# Patient Record
Sex: Male | Born: 1965 | Race: White | Hispanic: No | Marital: Single | Smoking: Never smoker
Health system: Southern US, Community
[De-identification: ages and names within clinical notes are randomized; demographics above are authoritative.]

## PROBLEM LIST (undated history)

## (undated) DIAGNOSIS — M4802 Spinal stenosis, cervical region: Secondary | ICD-10-CM

## (undated) DIAGNOSIS — D649 Anemia, unspecified: Secondary | ICD-10-CM

## (undated) DIAGNOSIS — Z5189 Encounter for other specified aftercare: Secondary | ICD-10-CM

## (undated) DIAGNOSIS — Z86010 Personal history of colonic polyps: Secondary | ICD-10-CM

## (undated) DIAGNOSIS — R945 Abnormal results of liver function studies: Secondary | ICD-10-CM

## (undated) DIAGNOSIS — R7989 Other specified abnormal findings of blood chemistry: Secondary | ICD-10-CM

## (undated) DIAGNOSIS — L400 Psoriasis vulgaris: Secondary | ICD-10-CM

## (undated) DIAGNOSIS — M1990 Unspecified osteoarthritis, unspecified site: Secondary | ICD-10-CM

## (undated) DIAGNOSIS — E785 Hyperlipidemia, unspecified: Secondary | ICD-10-CM

## (undated) HISTORY — DX: Psoriasis vulgaris: L40.0

## (undated) HISTORY — DX: Hyperlipidemia, unspecified: E78.5

## (undated) HISTORY — DX: Anemia, unspecified: D64.9

## (undated) HISTORY — DX: Personal history of colonic polyps: Z86.010

## (undated) HISTORY — DX: Encounter for other specified aftercare: Z51.89

## (undated) HISTORY — DX: Other specified abnormal findings of blood chemistry: R79.89

## (undated) HISTORY — PX: TONSILLECTOMY: SUR1361

## (undated) HISTORY — PX: MANDIBLE SURGERY: SHX707

## (undated) HISTORY — DX: Spinal stenosis, cervical region: M48.02

## (undated) HISTORY — DX: Abnormal results of liver function studies: R94.5

---

## 2004-09-11 ENCOUNTER — Ambulatory Visit: Payer: Self-pay | Admitting: Internal Medicine

## 2004-09-18 ENCOUNTER — Ambulatory Visit: Payer: Self-pay | Admitting: Internal Medicine

## 2004-09-26 ENCOUNTER — Ambulatory Visit: Payer: Self-pay | Admitting: Internal Medicine

## 2005-12-28 ENCOUNTER — Ambulatory Visit: Payer: Self-pay | Admitting: Internal Medicine

## 2005-12-28 LAB — CONVERTED CEMR LAB
ALT: 32 units/L (ref 0–40)
AST: 33 units/L (ref 0–37)
Albumin: 4.2 g/dL (ref 3.5–5.2)
Alkaline Phosphatase: 40 units/L (ref 39–117)
BUN: 13 mg/dL (ref 6–23)
Basophils Absolute: 0 10*3/uL (ref 0.0–0.1)
Basophils Relative: 0.1 % (ref 0.0–1.0)
CO2: 28 meq/L (ref 19–32)
Calcium: 9.3 mg/dL (ref 8.4–10.5)
Chloride: 104 meq/L (ref 96–112)
Chol/HDL Ratio, serum: 4
Cholesterol: 261 mg/dL (ref 0–200)
Creatinine, Ser: 0.9 mg/dL (ref 0.4–1.5)
Eosinophil percent: 1.4 % (ref 0.0–5.0)
GFR calc non Af Amer: 99 mL/min
Glomerular Filtration Rate, Af Am: 120 mL/min/{1.73_m2}
Glucose, Bld: 91 mg/dL (ref 70–99)
HCT: 45.2 % (ref 39.0–52.0)
HDL: 65.4 mg/dL (ref >39.0)
Hemoglobin: 15.2 g/dL (ref 13.0–17.0)
LDL DIRECT: 153.4 mg/dL
Lymphocytes Relative: 31.9 % (ref 12.0–46.0)
MCHC: 33.7 g/dL (ref 30.0–36.0)
MCV: 93.1 fL (ref 78.0–100.0)
Monocytes Absolute: 0.7 10*3/uL (ref 0.2–0.7)
Monocytes Relative: 12.3 % — ABNORMAL HIGH (ref 3.0–11.0)
Neutro Abs: 3.3 10*3/uL (ref 1.4–7.7)
Neutrophils Relative %: 54.3 % (ref 43.0–77.0)
Platelets: 293 10*3/uL (ref 150–400)
Potassium: 4.1 meq/L (ref 3.5–5.1)
RBC: 4.86 M/uL (ref 4.22–5.81)
RDW: 11.4 % — ABNORMAL LOW (ref 11.5–14.6)
Sodium: 139 meq/L (ref 135–145)
TSH: 2.96 microintl units/mL (ref 0.35–5.50)
Total Bilirubin: 1 mg/dL (ref 0.3–1.2)
Total Protein: 7.6 g/dL (ref 6.0–8.3)
Triglyceride fasting, serum: 318 mg/dL (ref 0–149)
VLDL: 64 mg/dL — ABNORMAL HIGH (ref 0–40)
WBC: 6 10*3/uL (ref 4.5–10.5)

## 2006-01-12 ENCOUNTER — Ambulatory Visit: Payer: Self-pay | Admitting: Internal Medicine

## 2006-05-12 ENCOUNTER — Ambulatory Visit: Payer: Self-pay | Admitting: Internal Medicine

## 2006-05-12 LAB — CONVERTED CEMR LAB
ALT: 55 units/L — ABNORMAL HIGH (ref 0–40)
AST: 42 units/L — ABNORMAL HIGH (ref 0–37)
Albumin: 3.9 g/dL (ref 3.5–5.2)
Alkaline Phosphatase: 42 units/L (ref 39–117)
Bilirubin, Direct: 0.2 mg/dL (ref 0.0–0.3)
Cholesterol: 183 mg/dL (ref 0–200)
Direct LDL: 84.2 mg/dL
HDL: 64.6 mg/dL (ref >39.0)
Total Bilirubin: 1 mg/dL (ref 0.3–1.2)
Total CHOL/HDL Ratio: 2.8
Total Protein: 7 g/dL (ref 6.0–8.3)
Triglycerides: 216 mg/dL (ref 0–149)
VLDL: 43 mg/dL — ABNORMAL HIGH (ref 0–40)

## 2006-05-18 ENCOUNTER — Ambulatory Visit: Payer: Self-pay | Admitting: Internal Medicine

## 2006-08-31 DIAGNOSIS — E785 Hyperlipidemia, unspecified: Secondary | ICD-10-CM | POA: Insufficient documentation

## 2006-09-14 ENCOUNTER — Telehealth: Payer: Self-pay | Admitting: Internal Medicine

## 2006-09-30 ENCOUNTER — Telehealth (INDEPENDENT_AMBULATORY_CARE_PROVIDER_SITE_OTHER): Payer: Self-pay | Admitting: *Deleted

## 2006-10-01 ENCOUNTER — Telehealth: Payer: Self-pay | Admitting: Internal Medicine

## 2006-10-12 ENCOUNTER — Telehealth: Payer: Self-pay | Admitting: Internal Medicine

## 2006-12-28 ENCOUNTER — Telehealth: Payer: Self-pay | Admitting: Internal Medicine

## 2007-05-02 ENCOUNTER — Ambulatory Visit: Payer: Self-pay | Admitting: Internal Medicine

## 2007-05-04 ENCOUNTER — Encounter: Payer: Self-pay | Admitting: Internal Medicine

## 2007-05-04 LAB — CONVERTED CEMR LAB
Sex Hormone Binding: 26 nmol/L (ref 13–71)
Testosterone Free: 91.9 pg/mL (ref 47.0–244.0)
Testosterone-% Free: 2.3 % (ref 1.6–2.9)
Testosterone: 396.67 ng/dL (ref 350–890)

## 2007-05-10 LAB — CONVERTED CEMR LAB
ALT: 38 units/L (ref 0–53)
AST: 35 units/L (ref 0–37)
Albumin: 4 g/dL (ref 3.5–5.2)
Alkaline Phosphatase: 39 units/L (ref 39–117)
Bilirubin, Direct: 0.1 mg/dL (ref 0.0–0.3)
Cholesterol: 213 mg/dL (ref 0–200)
Direct LDL: 110.9 mg/dL
HDL: 71.9 mg/dL (ref >39.0)
Testosterone: 300.32 ng/dL — ABNORMAL LOW (ref 350.00–890)
Total Bilirubin: 0.8 mg/dL (ref 0.3–1.2)
Total CHOL/HDL Ratio: 3
Total Protein: 7 g/dL (ref 6.0–8.3)
Triglycerides: 225 mg/dL (ref 0–149)
VLDL: 45 mg/dL — ABNORMAL HIGH (ref 0–40)

## 2007-06-28 ENCOUNTER — Ambulatory Visit: Payer: Self-pay | Admitting: Internal Medicine

## 2008-04-06 ENCOUNTER — Ambulatory Visit: Payer: Self-pay | Admitting: Internal Medicine

## 2008-04-06 LAB — CONVERTED CEMR LAB
ALT: 50 units/L (ref 0–53)
AST: 38 units/L — ABNORMAL HIGH (ref 0–37)
Albumin: 4 g/dL (ref 3.5–5.2)
Alkaline Phosphatase: 59 units/L (ref 39–117)
Basophils Absolute: 0 10*3/uL (ref 0.0–0.1)
Basophils Relative: 0.3 % (ref 0.0–3.0)
Bilirubin, Direct: 0.2 mg/dL (ref 0.0–0.3)
Cholesterol: 186 mg/dL (ref 0–200)
Eosinophils Absolute: 0.1 10*3/uL (ref 0.0–0.7)
Eosinophils Relative: 1.4 % (ref 0.0–5.0)
HCT: 44.2 % (ref 39.0–52.0)
HDL: 66 mg/dL (ref >39.0)
Hemoglobin: 15.2 g/dL (ref 13.0–17.0)
LDL Cholesterol: 104 mg/dL — ABNORMAL HIGH (ref 0–99)
Lymphocytes Relative: 24.7 % (ref 12.0–46.0)
MCHC: 34.3 g/dL (ref 30.0–36.0)
MCV: 92.3 fL (ref 78.0–100.0)
Monocytes Absolute: 1 10*3/uL (ref 0.1–1.0)
Monocytes Relative: 11.2 % (ref 3.0–12.0)
Neutro Abs: 5.3 10*3/uL (ref 1.4–7.7)
Neutrophils Relative %: 62.4 % (ref 43.0–77.0)
Platelets: 224 10*3/uL (ref 150–400)
RBC: 4.79 M/uL (ref 4.22–5.81)
RDW: 11.9 % (ref 11.5–14.6)
Total Bilirubin: 1.1 mg/dL (ref 0.3–1.2)
Total CHOL/HDL Ratio: 2.8
Total Protein: 7.1 g/dL (ref 6.0–8.3)
Triglycerides: 80 mg/dL (ref 0–149)
VLDL: 16 mg/dL (ref 0–40)
WBC: 8.5 10*3/uL (ref 4.5–10.5)

## 2008-05-07 ENCOUNTER — Ambulatory Visit: Payer: Self-pay | Admitting: Family Medicine

## 2009-02-14 ENCOUNTER — Ambulatory Visit: Payer: Self-pay | Admitting: Internal Medicine

## 2009-02-14 DIAGNOSIS — L408 Other psoriasis: Secondary | ICD-10-CM | POA: Insufficient documentation

## 2009-04-01 ENCOUNTER — Ambulatory Visit: Payer: Self-pay | Admitting: Internal Medicine

## 2009-06-26 ENCOUNTER — Ambulatory Visit: Payer: Self-pay | Admitting: Family Medicine

## 2009-06-26 LAB — CONVERTED CEMR LAB
Bilirubin Urine: NEGATIVE
Glucose, Urine, Semiquant: NEGATIVE
Ketones, urine, test strip: NEGATIVE
Nitrite: NEGATIVE
Specific Gravity, Urine: >=1.03
Urobilinogen, UA: 0.2
WBC Urine, dipstick: NEGATIVE
pH: 5

## 2009-06-27 LAB — CONVERTED CEMR LAB
Basophils Absolute: 0 10*3/uL (ref 0.0–0.1)
Basophils Relative: 0.3 % (ref 0.0–3.0)
Eosinophils Absolute: 0.1 10*3/uL (ref 0.0–0.7)
Eosinophils Relative: 0.9 % (ref 0.0–5.0)
HCT: 42.7 % (ref 39.0–52.0)
Hemoglobin: 14.7 g/dL (ref 13.0–17.0)
Lymphocytes Relative: 23.6 % (ref 12.0–46.0)
Lymphs Abs: 2.5 10*3/uL (ref 0.7–4.0)
MCHC: 34.4 g/dL (ref 30.0–36.0)
MCV: 91.1 fL (ref 78.0–100.0)
Monocytes Absolute: 0.9 10*3/uL (ref 0.1–1.0)
Monocytes Relative: 8.4 % (ref 3.0–12.0)
Neutro Abs: 7 10*3/uL (ref 1.4–7.7)
Neutrophils Relative %: 66.8 % (ref 43.0–77.0)
Platelets: 199 10*3/uL (ref 150.0–400.0)
RBC: 4.69 M/uL (ref 4.22–5.81)
RDW: 13.1 % (ref 11.5–14.6)
WBC: 10.5 10*3/uL (ref 4.5–10.5)

## 2009-12-10 ENCOUNTER — Ambulatory Visit: Payer: Self-pay | Admitting: Family Medicine

## 2009-12-10 ENCOUNTER — Encounter: Payer: Self-pay | Admitting: Family Medicine

## 2009-12-10 LAB — CONVERTED CEMR LAB
ALT: 93 units/L — ABNORMAL HIGH (ref 0–53)
AST: 74 units/L — ABNORMAL HIGH (ref 0–37)
Albumin: 4.4 g/dL (ref 3.5–5.2)
Alkaline Phosphatase: 46 units/L (ref 39–117)
BUN: 18 mg/dL (ref 6–23)
Basophils Absolute: 0 10*3/uL (ref 0.0–0.1)
Basophils Relative: 0.3 % (ref 0.0–3.0)
Bilirubin Urine: NEGATIVE
Bilirubin, Direct: 0.2 mg/dL (ref 0.0–0.3)
Blood in Urine, dipstick: NEGATIVE
CO2: 27 meq/L (ref 19–32)
Calcium: 9.4 mg/dL (ref 8.4–10.5)
Chloride: 104 meq/L (ref 96–112)
Cholesterol: 224 mg/dL — ABNORMAL HIGH (ref 0–200)
Creatinine, Ser: 0.9 mg/dL (ref 0.4–1.5)
Direct LDL: 118.1 mg/dL
Eosinophils Absolute: 0.1 10*3/uL (ref 0.0–0.7)
Eosinophils Relative: 1.3 % (ref 0.0–5.0)
GFR calc non Af Amer: 96.82 mL/min (ref >60)
Glucose, Bld: 83 mg/dL (ref 70–99)
Glucose, Urine, Semiquant: NEGATIVE
HCT: 42.8 % (ref 39.0–52.0)
HDL: 74.3 mg/dL (ref >39.00)
Hemoglobin: 15.2 g/dL (ref 13.0–17.0)
Ketones, urine, test strip: NEGATIVE
Lymphocytes Relative: 29.8 % (ref 12.0–46.0)
Lymphs Abs: 2.3 10*3/uL (ref 0.7–4.0)
MCHC: 35.4 g/dL (ref 30.0–36.0)
MCV: 92.2 fL (ref 78.0–100.0)
Monocytes Absolute: 0.8 10*3/uL (ref 0.1–1.0)
Monocytes Relative: 9.6 % (ref 3.0–12.0)
Neutro Abs: 4.6 10*3/uL (ref 1.4–7.7)
Neutrophils Relative %: 59 % (ref 43.0–77.0)
Nitrite: NEGATIVE
Platelets: 217 10*3/uL (ref 150.0–400.0)
Potassium: 3.8 meq/L (ref 3.5–5.1)
Protein, U semiquant: NEGATIVE
RBC: 4.64 M/uL (ref 4.22–5.81)
RDW: 12.6 % (ref 11.5–14.6)
Sodium: 139 meq/L (ref 135–145)
Specific Gravity, Urine: 1.025
TSH: 2.71 microintl units/mL (ref 0.35–5.50)
Total Bilirubin: 1.3 mg/dL — ABNORMAL HIGH (ref 0.3–1.2)
Total CHOL/HDL Ratio: 3
Total Protein: 7.4 g/dL (ref 6.0–8.3)
Triglycerides: 166 mg/dL — ABNORMAL HIGH (ref 0.0–149.0)
Urobilinogen, UA: 0.2
VLDL: 33.2 mg/dL (ref 0.0–40.0)
WBC Urine, dipstick: NEGATIVE
WBC: 7.8 10*3/uL (ref 4.5–10.5)
pH: 5

## 2009-12-17 ENCOUNTER — Telehealth: Payer: Self-pay | Admitting: Family Medicine

## 2009-12-17 DIAGNOSIS — R945 Abnormal results of liver function studies: Secondary | ICD-10-CM | POA: Insufficient documentation

## 2009-12-24 ENCOUNTER — Encounter: Payer: Self-pay | Admitting: Family Medicine

## 2010-01-17 ENCOUNTER — Telehealth: Payer: Self-pay | Admitting: Family Medicine

## 2010-01-17 ENCOUNTER — Ambulatory Visit: Payer: Self-pay | Admitting: Family Medicine

## 2010-01-17 LAB — CONVERTED CEMR LAB
ALT: 53 units/L (ref 0–53)
AST: 39 units/L — ABNORMAL HIGH (ref 0–37)
Albumin: 4.2 g/dL (ref 3.5–5.2)
Alkaline Phosphatase: 44 units/L (ref 39–117)
Bilirubin, Direct: 0.1 mg/dL (ref 0.0–0.3)
Total Bilirubin: 0.9 mg/dL (ref 0.3–1.2)
Total Protein: 7.1 g/dL (ref 6.0–8.3)

## 2010-03-12 NOTE — Assessment & Plan Note (Signed)
Summary: dry skin problem/njr   Vital Signs:  Patient profile:   45 year old male Weight:      184 pounds Temp:     97.9 degrees F Pulse rate:   80 / minute Resp:     12 per minute BP sitting:   114 / 80  (left arm)  Vitals Entered By: Gladis Riffle, RN (February 14, 2009 7:50 AM)   History of Present Illness: has noted dry patches on knees and elbows for about 8 months.  pruritic has tried multiple emolients without success.   All other systems reviewed and were negative   Preventive Screening-Counseling & Management  Alcohol-Tobacco     Alcohol drinks/day: 3     Smoking Status: never  Allergies: 1)  Demerol (Meperidine Hcl)  Comments:  Nurse/Medical Assistant: c/o patches of dry skins on bilateral knees, right elbow, and now on forehead--Has tried several moisturizers and cortisone cream with only temporary relief  The patient's medications and allergies were reviewed with the patient and were updated in the Medication and Allergy Lists. Gladis Riffle, RN (February 14, 2009 7:52 AM)  Social History:  Never Smoked Alcohol use-yes Regular exercise-yes Married--divorced 2010  Physical Exam  General:  Well-developed,well-nourished,in no acute distress; alert,appropriate and cooperative throughout examination Head:  normocephalic and atraumatic.   Eyes:  pupils equal and pupils round.   Chest Wall:  No deformities, masses, tenderness or gynecomastia noted. Lungs:  normal respiratory effort and no intercostal retractions.   Heart:  normal rate and regular rhythm.   Skin:  large scaling plaques on knees and elbows bilaterally  2 cm area of scaling rash on forehead   Impression & Recommendations:  Problem # 1:  PSORIASIS (ICD-696.1) Assessment New discussed treatment plans and etiology trial topical steroids side effects discussed see me 4-6 weeks  Complete Medication List: 1)  Simvastatin 20 Mg Tabs (Simvastatin) .... Take 1 tablet by mouth at bedtime 2)   Cialis 20 Mg Tabs (Tadalafil) .Marland Kitchen.. 1 tablet every other day as needed 3)  Triamcinolone Acetonide 0.5 % Crea (Triamcinolone acetonide) .... Apply bid to affected area 4)  Desonide 0.05 % Crea (Desonide) .... Apply to affected area on face two times a day for 5 days and then at bedtime  Patient Instructions: 1)  see me 4 - 6 weeks  Prescriptions: DESONIDE 0.05 % CREA (DESONIDE) apply to affected area on face two times a day for 5 days and then at bedtime  #15 grams x 1   Entered and Authorized by:   Birdie Sons MD   Signed by:   Birdie Sons MD on 02/14/2009   Method used:   Electronically to        [facility]  [phone]* (retail)       [address] [place], [place]  [address]       Ph: [phone] or [phone]       Fax: [phone]   RxID:   [id number] TRIAMCINOLONE ACETONIDE 0.5 % CREA (TRIAMCINOLONE ACETONIDE) apply bid to affected area  #30 gram x 2   Entered and Authorized by:   Birdie Sons MD   Signed by:   Birdie Sons MD on 02/14/2009   Method used:   Electronically to        [facility]  [phone]* (retail)       [address] [place], [place]  [address]  Ph: [phone] or [phone]       Fax: [phone]   RxID:   [id number]

## 2010-03-12 NOTE — Progress Notes (Signed)
     New Problems: NONSPECIFIC ABNORMAL RESULTS LIVR FUNCTION STUDY (ICD-794.8)   New Problems: NONSPECIFIC ABNORMAL RESULTS LIVR FUNCTION STUDY (ICD-794.8)

## 2010-03-12 NOTE — Assessment & Plan Note (Signed)
Summary: abd pain/njr   Vital Signs:  Patient profile:   45 year old male Temp:     98.1 degrees F oral BP sitting:   130 / 84  (left arm) Cuff size:   regular  Vitals Entered By: Sid Falcon LPN (Jun 26, 2009 3:06 PM) CC: abd pain   History of Present Illness: Patient seen for a history left lower quadrant abdominal pain.  Onset 4 days ago.   Described as soreness moderate intensity and relatively continuous. No radiation. Denies any fever, chills, change in bowel habits, loss of appetite, or any urinary symptoms. He recalls somewhat similar episode at some point in the past. No known history of diverticular disease. No recent weight changes.  Patient does a lot of weight lifting but denies any recent injury. Pain worse with sitting up and somewhat with ambulation. No alleviating factors.  Tried advil without relief.  No back pain.  No hx kidney stones.  Allergies: 1)  Demerol (Meperidine Hcl)  Past History:  Past Medical History: Last updated: 04/01/2009 Hyperlipidemia Psoriasis (2011)  Past Surgical History: Last updated: 08/26/2006 Jaw sx Knee arthroscopy  Family History: Last updated: 08/26/2006 Pt adopted  Social History: Last updated: 02/14/2009  Never Smoked Alcohol use-yes Regular exercise-yes Married--divorced 2010  Risk Factors: Alcohol Use: 3 (04/01/2009) Exercise: yes (08/26/2006)  Risk Factors: Smoking Status: never (04/01/2009)  Review of Systems  The patient denies anorexia, fever, weight loss, chest pain, melena, hematochezia, severe indigestion/heartburn, hematuria, incontinence, suspicious skin lesions, enlarged lymph nodes, and testicular masses.    Physical Exam  General:  Well-developed,well-nourished,in no acute distress; alert,appropriate and cooperative throughout examination Mouth:  Oral mucosa and oropharynx without lesions or exudates.  Teeth in good repair. Neck:  No deformities, masses, or tenderness noted. Lungs:  Normal  respiratory effort, chest expands symmetrically. Lungs are clear to auscultation, no crackles or wheezes. Heart:  Normal rate and regular rhythm. S1 and S2 normal without gallop, murmur, click, rub or other extra sounds. Abdomen:  soft, non-tender, normal bowel sounds, no distention, no hepatomegaly, and no splenomegaly.cannot reproduce in the left lower quadrant tenderness to palpation the patient does have some pain with sitting forward   Genitalia:  Testes bilaterally descended without nodularity, tenderness or masses. No scrotal masses or lesions. No penis lesions or urethral discharge.   Impression & Recommendations:  Problem # 1:  ABDOMINAL PAIN, LEFT LOWER QUADRANT, HX OF (ICD-V15.89) ?diverticular vs abd muscular strain.  Check CBC ( UA unremarkable).   Orders: UA Dipstick w/o Micro (manual) (16109) TLB-CBC Platelet - w/Differential (85025-CBCD)  Complete Medication List: 1)  Simvastatin 20 Mg Tabs (Simvastatin) .... Take 1 tablet by mouth at bedtime 2)  Cialis 20 Mg Tabs (Tadalafil) .Marland Kitchen.. 1 tablet every other day as needed 3)  Triamcinolone Acetonide 0.5 % Crea (Triamcinolone acetonide) .... Apply bid to affected area 4)  Desonide 0.05 % Crea (Desonide) .... Apply to affected area on face two times a day for 5 days and then at bedtime 5)  Ciprofloxacin Hcl 500 Mg Tabs (Ciprofloxacin hcl) .... One by mouth two times a day for 10 days  Patient Instructions: 1)  follow up promptly if you have any fever or worsening abdominal pain Prescriptions: CIPROFLOXACIN HCL 500 MG TABS (CIPROFLOXACIN HCL) one by mouth two times a day for 10 days  #20 x 0   Entered and Authorized by:   Evelena Peat MD   Signed by:   Evelena Peat MD on 06/26/2009   Method used:  Electronically to        [facility]  [phone]* (retail)       [address] [place], [place]  [address]       Ph: [phone] or [phone]       Fax: [phone]   RxID:   [id number]   Laboratory  Results   Urine Tests    Routine Urinalysis   Color: yellow Appearance: Clear Glucose: negative   (Normal Range: Negative) Bilirubin: negative   (Normal Range: Negative) Ketone: negative   (Normal Range: Negative) Spec. Gravity: >=1.030   (Normal Range: 1.003-1.035) Blood: trace-intact   (Normal Range: Negative) pH: 5.0   (Normal Range: 5.0-8.0) Protein: trace   (Normal Range: Negative) Urobilinogen: 0.2   (Normal Range: 0-1) Nitrite: negative   (Normal Range: Negative) Leukocyte Esterace: negative   (Normal Range: Negative)    Comments: Sid Falcon LPN  Jun 26, 2009 4:25 PM

## 2010-03-12 NOTE — Assessment & Plan Note (Signed)
Summary: skin condition//lch   Vital Signs:  Patient profile:   45 year old male Temp:     98.3 degrees F oral BP sitting:   122 / 82  (left arm) Cuff size:   regular  Vitals Entered By: Kern Reap CMA Duncan Dull) (December 10, 2009 3:05 PM) CC: skin condition, labs   CC:  skin condition and labs.  History of Present Illness: and he is a 45 year old single male, nonsmoker, who comes in today for general physical examination  I have known him for many years.  He grew up in the neighborhood and then played football at [employer].  He currently works at [employer] and [employer] about 45 hours a week.  In his free time he goes to the gym.  He lives alone single.  He takes simvastatin 20 mg nightly for hyperlipidemia.  Will check cholesterol today.  He also uses Taclonex daily for 4 weeks, then off for the psoriasis.  Review of systems negative except for 6 beers a day  Allergies: 1)  Demerol (Meperidine Hcl)  Past History:  Past medical, surgical, family and social histories (including risk factors) reviewed, and no changes noted (except as noted below).  Past Medical History: Reviewed history from 04/01/2009 and no changes required. Hyperlipidemia Psoriasis (2011)  Past Surgical History: Reviewed history from 08/26/2006 and no changes required. Jaw sx Knee arthroscopy  Family History: Reviewed history from 08/26/2006 and no changes required. Pt adopted  Social History: Reviewed history from 02/14/2009 and no changes required.  Never Smoked Alcohol use-yes Regular exercise-yes Married--divorced 2010  Review of Systems      See HPI  Physical Exam  General:  Well-developed,well-nourished,in no acute distress; alert,appropriate and cooperative throughout examination Head:  Normocephalic and atraumatic without obvious abnormalities. No apparent alopecia or balding. Eyes:  No corneal or conjunctival inflammation noted. EOMI. Perrla. Funduscopic exam benign, without  hemorrhages, exudates or papilledema. Vision grossly normal. Ears:  External ear exam shows no significant lesions or deformities.  Otoscopic examination reveals clear canals, tympanic membranes are intact bilaterally without bulging, retraction, inflammation or discharge. Hearing is grossly normal bilaterally. Nose:  External nasal examination shows no deformity or inflammation. Nasal mucosa are pink and moist without lesions or exudates. Mouth:  Oral mucosa and oropharynx without lesions or exudates.  Teeth in good repair. Neck:  No deformities, masses, or tenderness noted. Chest Wall:  No deformities, masses, tenderness or gynecomastia noted. Breasts:  No masses or gynecomastia noted Lungs:  Normal respiratory effort, chest expands symmetrically. Lungs are clear to auscultation, no crackles or wheezes. Heart:  Normal rate and regular rhythm. S1 and S2 normal without gallop, murmur, click, rub or other extra sounds. Abdomen:  Bowel sounds positive,abdomen soft and non-tender without masses, organomegaly or hernias noted. Genitalia:  Testes bilaterally descended without nodularity, tenderness or masses. No scrotal masses or lesions. No penis lesions or urethral discharge. Msk:  No deformity or scoliosis noted of thoracic or lumbar spine.   Pulses:  R and L carotid,radial,femoral,dorsalis pedis and posterior tibial pulses are full and equal bilaterally Extremities:  No clubbing, cyanosis, edema, or deformity noted with normal full range of motion of all joints.   Neurologic:  No cranial nerve deficits noted. Station and gait are normal. Plantar reflexes are down-going bilaterally. DTRs are symmetrical throughout. Sensory, motor and coordinative functions appear intact. Skin:  Intact without suspicious lesions or rashes Cervical Nodes:  No lymphadenopathy noted Axillary Nodes:  No palpable lymphadenopathy Inguinal Nodes:  No significant adenopathy  Psych:  Cognition and judgment appear intact. Alert  and cooperative with normal attention span and concentration. No apparent delusions, illusions, hallucinations   Impression & Recommendations:  Problem # 1:  PSORIASIS (ICD-696.1) Assessment Unchanged  Orders: Venipuncture (16109) TLB-Lipid Panel (80061-LIPID) TLB-BMP (Basic Metabolic Panel-BMET) (80048-METABOL) TLB-CBC Platelet - w/Differential (85025-CBCD) TLB-Hepatic/Liver Function Pnl (80076-HEPATIC) TLB-TSH (Thyroid Stimulating Hormone) (60454-UJW) Prescription Created Electronically [phone]) UA Dipstick w/o Micro (automated)  (81003) Specimen Handling (78295)  Problem # 2:  HYPERLIPIDEMIA (ICD-272.4) Assessment: Improved  His updated medication list for this problem includes:    Simvastatin 20 Mg Tabs (Simvastatin) .Marland Kitchen... Take 1 tablet by mouth at bedtime  Orders: Venipuncture (62130) TLB-Lipid Panel (80061-LIPID) TLB-BMP (Basic Metabolic Panel-BMET) (80048-METABOL) TLB-CBC Platelet - w/Differential (85025-CBCD) TLB-Hepatic/Liver Function Pnl (80076-HEPATIC) TLB-TSH (Thyroid Stimulating Hormone) (86578-ION) Prescription Created Electronically [phone]) UA Dipstick w/o Micro (automated)  (81003) EKG w/ Interpretation (93000) Specimen Handling (84132)  Problem # 3:  Preventive Health Care (ICD-V70.0) Assessment: Unchanged  Complete Medication List: 1)  Simvastatin 20 Mg Tabs (Simvastatin) .... Take 1 tablet by mouth at bedtime 2)  Taclonex 0.005-0.064 % Oint (Calcipotriene-betameth diprop) .... Apply at bedtime  Other Orders: Tdap => 54yrs IM (44010) Admin 1st Vaccine (27253)  Patient Instructions: 1)  I will call you with your lab work. 2)  Continue to use thetaclonex small amounts daily for 4 weeks, then stop, then use again. 3)  Take an Aspirin every day. 4)  Continue the simvastatin, one nightly at bedtime 5)  Please schedule a follow-up appointment in 1 year. Prescriptions: SIMVASTATIN 20 MG TABS (SIMVASTATIN) Take 1 tablet by mouth at bedtime  #100 x 3    Entered and Authorized by:   Roderick Pee MD   Signed by:   Roderick Pee MD on 12/10/2009   Method used:   [facility] to        [facility]  [phone]* (retail)       [address] [place], [place]  [address]       Ph: [phone] or [phone]       Fax: [phone]   RxID:   [id number] TACLONEX 0.005-0.064 % OINT (CALCIPOTRIENE-BETAMETH DIPROP) apply at bedtime  #100 gr x 6   Entered and Authorized by:   Roderick Pee MD   Signed by:   Roderick Pee MD on 12/10/2009   Method used:   Electronically to        [facility]  [phone]* (retail)       [address] [place], [place]  [address]       Ph: [phone] or [phone]       Fax: [phone]   RxID:   [id number]    Orders Added: 1)  Est. Patient 40-64 years [99396] 2)  Venipuncture [89381] 3)  TLB-Lipid Panel [80061-LIPID] 4)  TLB-BMP (Basic Metabolic Panel-BMET) [80048-METABOL] 5)  TLB-CBC Platelet - w/Differential [85025-CBCD] 6)  TLB-Hepatic/Liver Function Pnl [80076-HEPATIC] 7)  TLB-TSH (Thyroid Stimulating Hormone) [84443-TSH] 8)  Prescription Created Electronically [G8553] 9)  UA Dipstick w/o Micro (automated)  [81003] 10)  EKG w/ Interpretation [93000] 11)  Tdap => 4yrs IM [90715] 12)  Admin 1st Vaccine [90471] 13)  Specimen Handling [99000]   Immunizations Administered:  Tetanus Vaccine:    Vaccine Type: Tdap    Site: right deltoid    Mfr: GlaxoSmithKline    Dose: 0.5 ml    Route: IM    Given  by: Kern Reap CMA (AAMA)    Exp. Date: 11/29/2011    Lot #: [id number]    Physician counseled: yes   Immunizations Administered:  Tetanus Vaccine:    Vaccine Type: Tdap    Site: right deltoid    Mfr: GlaxoSmithKline    Dose: 0.5 ml    Route: IM    Given by: Kern Reap CMA (AAMA)    Exp. Date: 11/29/2011    Lot #: [id number]    Physician counseled: yes   Laboratory Results   Urine Tests    Routine Urinalysis   Color: yellow Appearance:  Clear Glucose: negative   (Normal Range: Negative) Bilirubin: negative   (Normal Range: Negative) Ketone: negative   (Normal Range: Negative) Spec. Gravity: 1.025   (Normal Range: 1.003-1.035) Blood: negative   (Normal Range: Negative) pH: 5.0   (Normal Range: 5.0-8.0) Protein: negative   (Normal Range: Negative) Urobilinogen: 0.2   (Normal Range: 0-1) Nitrite: negative   (Normal Range: Negative) Leukocyte Esterace: negative   (Normal Range: Negative)    Comments: Rita Ohara  December 10, 2009 4:05 PM

## 2010-03-12 NOTE — Progress Notes (Signed)
  Phone Note Outgoing Call   Summary of Call: I call the M.D., and left a message that his liver functions have returned to normal Initial call taken by: Roderick Pee MD,  January 17, 2010 5:36 PM

## 2010-03-12 NOTE — Miscellaneous (Signed)
Summary: tdap  Clinical Lists Changes  Observations: Added new observation of TD BOOSTER: Historical (01/13/1999 16:54)      Immunization History:  Tetanus/Td Immunization History:    Tetanus/Td:  historical (01/13/1999)

## 2010-03-12 NOTE — Assessment & Plan Note (Signed)
Summary: 6 wk rov/njr   Vital Signs:  Patient profile:   45 year old male Weight:      184 pounds BMI:     28.08 Temp:     97.9 degrees F Pulse rate:   76 / minute Resp:     12 per minute BP sitting:   116 / 60  (left arm)  Vitals Entered By: Gladis Riffle, RN (April 01, 2009 7:52 AM) CC: 6 week rov, states not much change   Is Patient Diabetic? No   CC:  6 week rov and states not much change  .  History of Present Illness: psoriasis did well with cream but developed more patches duration--only 1 year--progressively worse minimal itch  Preventive Screening-Counseling & Management  Alcohol-Tobacco     Alcohol drinks/day: 3     Smoking Status: never  Allergies: 1)  Demerol (Meperidine Hcl)  Past History:  Past Surgical History: Last updated: 08/26/2006 Jaw sx Knee arthroscopy  Family History: Last updated: 08/26/2006 Pt adopted  Social History: Last updated: 02/14/2009  Never Smoked Alcohol use-yes Regular exercise-yes Married--divorced 2010  Risk Factors: Alcohol Use: 3 (04/01/2009) Exercise: yes (08/26/2006)  Risk Factors: Smoking Status: never (04/01/2009)  Past Medical History: Hyperlipidemia Psoriasis (2011)  Physical Exam  Skin:  turgor normal and color normal.    circumscibed plaques, erythematous and scaley.    Impression & Recommendations:  Problem # 1:  PSORIASIS (ICD-696.1)  progressive... refer dermatology had initial discussion about other therapies including immune modifyers.   Orders: Dermatology Referral (Derma)  Complete Medication List: 1)  Simvastatin 20 Mg Tabs (Simvastatin) .... Take 1 tablet by mouth at bedtime 2)  Cialis 20 Mg Tabs (Tadalafil) .Marland Kitchen.. 1 tablet every other day as needed 3)  Triamcinolone Acetonide 0.5 % Crea (Triamcinolone acetonide) .... Apply bid to affected area 4)  Desonide 0.05 % Crea (Desonide) .... Apply to affected area on face two times a day for 5 days and then at bedtime

## 2011-01-12 ENCOUNTER — Other Ambulatory Visit: Payer: Self-pay | Admitting: Family Medicine

## 2011-11-06 ENCOUNTER — Other Ambulatory Visit: Payer: Self-pay | Admitting: Neurosurgery

## 2011-11-13 ENCOUNTER — Encounter (HOSPITAL_COMMUNITY): Payer: Self-pay | Admitting: Pharmacy Technician

## 2011-11-17 ENCOUNTER — Encounter (HOSPITAL_COMMUNITY): Payer: Self-pay

## 2011-11-17 ENCOUNTER — Encounter (HOSPITAL_COMMUNITY)
Admission: RE | Admit: 2011-11-17 | Discharge: 2011-11-17 | Disposition: A | Discharge status: Home / Self Care | Payer: 59 | Source: Ambulatory Visit | Attending: Neurosurgery | Admitting: Neurosurgery

## 2011-11-17 HISTORY — DX: Unspecified osteoarthritis, unspecified site: M19.90

## 2011-11-17 LAB — CBC
HCT: 40.8 % (ref 39.0–52.0)
Hemoglobin: 14.1 g/dL (ref 13.0–17.0)
MCH: 31.5 pg (ref 26.0–34.0)
MCHC: 34.6 g/dL (ref 30.0–36.0)
MCV: 91.1 fL (ref 78.0–100.0)
Platelets: 218 10*3/uL (ref 150–400)
RBC: 4.48 MIL/uL (ref 4.22–5.81)
RDW: 12.3 % (ref 11.5–15.5)
WBC: 8.4 10*3/uL (ref 4.0–10.5)

## 2011-11-17 LAB — COMPREHENSIVE METABOLIC PANEL
ALT: 61 U/L — ABNORMAL HIGH (ref 0–53)
AST: 50 U/L — ABNORMAL HIGH (ref 0–37)
Albumin: 3.9 g/dL (ref 3.5–5.2)
Alkaline Phosphatase: 56 U/L (ref 39–117)
BUN: 17 mg/dL (ref 6–23)
CO2: 28 mEq/L (ref 19–32)
Calcium: 9.5 mg/dL (ref 8.4–10.5)
Chloride: 102 mEq/L (ref 96–112)
Creatinine, Ser: 0.91 mg/dL (ref 0.50–1.35)
GFR calc Af Amer: >90 mL/min (ref >90)
GFR calc non Af Amer: >90 mL/min (ref >90)
Glucose, Bld: 87 mg/dL (ref 70–99)
Potassium: 3.9 mEq/L (ref 3.5–5.1)
Sodium: 139 mEq/L (ref 135–145)
Total Bilirubin: 0.5 mg/dL (ref 0.3–1.2)
Total Protein: 7.1 g/dL (ref 6.0–8.3)

## 2011-11-17 LAB — SURGICAL PCR SCREEN
MRSA, PCR: NEGATIVE
Staphylococcus aureus: NEGATIVE

## 2011-11-17 NOTE — Pre-Procedure Instructions (Signed)
[address]  11/17/2011   Your procedure is scheduled on:  Monday November 23, 2011 0955 AM  Report to [facility] at [phone] AM.  Call this number if you have problems the morning of surgery: [phone]   Remember:   Do not eat food or drink:After Midnight.Sunday      Take these medicines the morning of surgery with A SIP OF WATER: None   Do not wear jewelry  Do not wear lotions, powders, or perfumes. You may wear deodorant.             Men may shave face and neck.  Do not bring valuables to the hospital.  Contacts, dentures or bridgework may not be worn into surgery.  Leave suitcase in the car. After surgery it may be brought to your room.  For patients admitted to the hospital, checkout time is 11:00 AM the day of discharge.   Patients discharged the day of surgery will not be allowed to drive home.    Special Instructions: Shower using CHG 2 nights before surgery and the night before surgery.  If you shower the day of surgery use CHG.  Use special wash - you have one bottle of CHG for all showers.  You should use approximately 1/3 of the bottle for each shower.   Please read over the following fact sheets that you were given: Pain Booklet, Coughing and Deep Breathing, MRSA Information and Surgical Site Infection Prevention

## 2011-11-17 NOTE — Progress Notes (Signed)
Primary Physician - Dr. Alonza Smoker at [facility] Does not have a cardiologist no prior cardiac work-up

## 2011-11-22 MED ORDER — CEFAZOLIN SODIUM-DEXTROSE 2-3 GM-% IV SOLR
2.0000 g | INTRAVENOUS | Status: AC
  Administered 2011-11-23: 2 g via INTRAVENOUS
  Filled 2011-11-22: qty 50

## 2011-11-23 ENCOUNTER — Ambulatory Visit (HOSPITAL_COMMUNITY): Payer: 59

## 2011-11-23 ENCOUNTER — Encounter (HOSPITAL_COMMUNITY)
Admission: RE | Disposition: A | Discharge status: Home / Self Care | Payer: Self-pay | Source: Ambulatory Visit | Attending: Neurosurgery

## 2011-11-23 ENCOUNTER — Encounter (HOSPITAL_COMMUNITY): Payer: Self-pay | Admitting: *Deleted

## 2011-11-23 ENCOUNTER — Encounter (HOSPITAL_COMMUNITY): Payer: Self-pay | Admitting: Anesthesiology

## 2011-11-23 ENCOUNTER — Ambulatory Visit (HOSPITAL_COMMUNITY)
Admission: RE | Admit: 2011-11-23 | Discharge: 2011-11-24 | Disposition: A | Discharge status: Home / Self Care | Payer: 59 | Source: Ambulatory Visit | Attending: Neurosurgery | Admitting: Neurosurgery

## 2011-11-23 ENCOUNTER — Ambulatory Visit (HOSPITAL_COMMUNITY): Payer: 59 | Admitting: Anesthesiology

## 2011-11-23 DIAGNOSIS — M5000 Cervical disc disorder with myelopathy, unspecified cervical region: Secondary | ICD-10-CM | POA: Insufficient documentation

## 2011-11-23 DIAGNOSIS — Z01812 Encounter for preprocedural laboratory examination: Secondary | ICD-10-CM | POA: Insufficient documentation

## 2011-11-23 DIAGNOSIS — M4712 Other spondylosis with myelopathy, cervical region: Secondary | ICD-10-CM | POA: Insufficient documentation

## 2011-11-23 HISTORY — PX: ANTERIOR CERVICAL DECOMP/DISCECTOMY FUSION: SHX1161

## 2011-11-23 SURGERY — ANTERIOR CERVICAL DECOMPRESSION/DISCECTOMY FUSION 2 LEVELS
Anesthesia: General | Wound class: Clean

## 2011-11-23 MED ORDER — ONDANSETRON HCL 4 MG/2ML IJ SOLN: 4.0000 mg | Freq: Once | INTRAMUSCULAR | Status: DC | PRN

## 2011-11-23 MED ORDER — HYDROMORPHONE HCL 2 MG PO TABS
4.0000 mg | ORAL_TABLET | ORAL | Status: DC | PRN
  Administered 2011-11-23 – 2011-11-24 (×3): 4 mg via ORAL
  Filled 2011-11-23 (×3): qty 2

## 2011-11-23 MED ORDER — DIAZEPAM 5 MG PO TABS
5.0000 mg | ORAL_TABLET | Freq: Four times a day (QID) | ORAL | Status: DC | PRN
  Administered 2011-11-23: 5 mg via ORAL
  Filled 2011-11-23: qty 1

## 2011-11-23 MED ORDER — DOCUSATE SODIUM 100 MG PO CAPS
100.0000 mg | ORAL_CAPSULE | Freq: Two times a day (BID) | ORAL | Status: DC
  Filled 2011-11-23: qty 1

## 2011-11-23 MED ORDER — BACITRACIN 50000 UNITS IM SOLR
INTRAMUSCULAR | Status: AC
  Filled 2011-11-23: qty 1

## 2011-11-23 MED ORDER — SIMVASTATIN 20 MG PO TABS
20.0000 mg | ORAL_TABLET | Freq: Every evening | ORAL | Status: DC
  Administered 2011-11-23: 20 mg via ORAL
  Filled 2011-11-23 (×2): qty 1

## 2011-11-23 MED ORDER — GLYCOPYRROLATE 0.2 MG/ML IJ SOLN
INTRAMUSCULAR | Status: DC | PRN
  Administered 2011-11-23: 0.6 mg via INTRAVENOUS

## 2011-11-23 MED ORDER — ONDANSETRON HCL 4 MG/2ML IJ SOLN: 4.0000 mg | INTRAMUSCULAR | Status: DC | PRN

## 2011-11-23 MED ORDER — SODIUM CHLORIDE 0.9 % IR SOLN
Status: DC | PRN
  Administered 2011-11-23: 10:00:00

## 2011-11-23 MED ORDER — DEXAMETHASONE 4 MG PO TABS
4.0000 mg | ORAL_TABLET | Freq: Four times a day (QID) | ORAL | Status: AC
  Administered 2011-11-23 – 2011-11-24 (×3): 4 mg via ORAL
  Filled 2011-11-23 (×3): qty 1

## 2011-11-23 MED ORDER — DEXAMETHASONE SODIUM PHOSPHATE 4 MG/ML IJ SOLN: 4.0000 mg | Freq: Four times a day (QID) | INTRAMUSCULAR | Status: AC

## 2011-11-23 MED ORDER — ZOLPIDEM TARTRATE 5 MG PO TABS: 5.0000 mg | ORAL_TABLET | Freq: Every evening | ORAL | Status: DC | PRN

## 2011-11-23 MED ORDER — OXYCODONE HCL 5 MG/5ML PO SOLN: 5.0000 mg | Freq: Once | ORAL | Status: DC | PRN

## 2011-11-23 MED ORDER — MORPHINE SULFATE 2 MG/ML IJ SOLN
1.0000 mg | INTRAMUSCULAR | Status: DC | PRN
  Administered 2011-11-23: 2 mg via INTRAVENOUS
  Administered 2011-11-24: 4 mg via INTRAVENOUS
  Filled 2011-11-23: qty 1
  Filled 2011-11-23: qty 2

## 2011-11-23 MED ORDER — HYDROMORPHONE HCL PF 1 MG/ML IJ SOLN
0.2500 mg | INTRAMUSCULAR | Status: DC | PRN
  Administered 2011-11-23 (×4): 0.5 mg via INTRAVENOUS

## 2011-11-23 MED ORDER — PHENOL 1.4 % MT LIQD: 1.0000 | OROMUCOSAL | Status: DC | PRN

## 2011-11-23 MED ORDER — CEFAZOLIN SODIUM-DEXTROSE 2-3 GM-% IV SOLR
2.0000 g | Freq: Three times a day (TID) | INTRAVENOUS | Status: DC
  Administered 2011-11-23 – 2011-11-24 (×2): 2 g via INTRAVENOUS
  Filled 2011-11-23 (×3): qty 50

## 2011-11-23 MED ORDER — BUPIVACAINE-EPINEPHRINE PF 0.5-1:200000 % IJ SOLN
INTRAMUSCULAR | Status: DC | PRN
  Administered 2011-11-23: 10 mL

## 2011-11-23 MED ORDER — ONDANSETRON HCL 4 MG/2ML IJ SOLN
INTRAMUSCULAR | Status: DC | PRN
  Administered 2011-11-23: 4 mg via INTRAVENOUS

## 2011-11-23 MED ORDER — FENTANYL CITRATE 0.05 MG/ML IJ SOLN
INTRAMUSCULAR | Status: DC | PRN
  Administered 2011-11-23 (×2): 50 ug via INTRAVENOUS
  Administered 2011-11-23: 100 ug via INTRAVENOUS
  Administered 2011-11-23 (×2): 50 ug via INTRAVENOUS
  Administered 2011-11-23: 100 ug via INTRAVENOUS

## 2011-11-23 MED ORDER — ACETAMINOPHEN 650 MG RE SUPP: 650.0000 mg | RECTAL | Status: DC | PRN

## 2011-11-23 MED ORDER — BACITRACIN ZINC 500 UNIT/GM EX OINT
TOPICAL_OINTMENT | CUTANEOUS | Status: DC | PRN
  Administered 2011-11-23: 1 via TOPICAL

## 2011-11-23 MED ORDER — ROCURONIUM BROMIDE 100 MG/10ML IV SOLN
INTRAVENOUS | Status: DC | PRN
  Administered 2011-11-23: 50 mg via INTRAVENOUS
  Administered 2011-11-23: 10 mg via INTRAVENOUS

## 2011-11-23 MED ORDER — MIDAZOLAM HCL 5 MG/5ML IJ SOLN
INTRAMUSCULAR | Status: DC | PRN
  Administered 2011-11-23: 2 mg via INTRAVENOUS

## 2011-11-23 MED ORDER — PROPOFOL 10 MG/ML IV BOLUS
INTRAVENOUS | Status: DC | PRN
  Administered 2011-11-23: 150 mg via INTRAVENOUS

## 2011-11-23 MED ORDER — HYDROMORPHONE HCL PF 1 MG/ML IJ SOLN
INTRAMUSCULAR | Status: AC
  Filled 2011-11-23: qty 1

## 2011-11-23 MED ORDER — PROPOFOL 10 MG/ML IV BOLUS: INTRAVENOUS | Status: DC | PRN

## 2011-11-23 MED ORDER — SODIUM CHLORIDE 0.9 % IV SOLN
INTRAVENOUS | Status: AC
  Filled 2011-11-23: qty 500

## 2011-11-23 MED ORDER — MENTHOL 3 MG MT LOZG: 1.0000 | LOZENGE | OROMUCOSAL | Status: DC | PRN

## 2011-11-23 MED ORDER — ARTIFICIAL TEARS OP OINT
TOPICAL_OINTMENT | OPHTHALMIC | Status: DC | PRN
  Administered 2011-11-23: 1 via OPHTHALMIC

## 2011-11-23 MED ORDER — OXYCODONE HCL 5 MG PO TABS: 5.0000 mg | ORAL_TABLET | Freq: Once | ORAL | Status: DC | PRN

## 2011-11-23 MED ORDER — HEMOSTATIC AGENTS (NO CHARGE) OPTIME
TOPICAL | Status: DC | PRN
  Administered 2011-11-23: 1 via TOPICAL

## 2011-11-23 MED ORDER — LACTATED RINGERS IV SOLN: INTRAVENOUS | Status: DC

## 2011-11-23 MED ORDER — LACTATED RINGERS IV SOLN
INTRAVENOUS | Status: DC | PRN
  Administered 2011-11-23 (×3): via INTRAVENOUS

## 2011-11-23 MED ORDER — LIDOCAINE HCL (CARDIAC) 20 MG/ML IV SOLN
INTRAVENOUS | Status: DC | PRN
  Administered 2011-11-23: 100 mg via INTRAVENOUS

## 2011-11-23 MED ORDER — MEPERIDINE HCL 25 MG/ML IJ SOLN: 6.2500 mg | INTRAMUSCULAR | Status: DC | PRN

## 2011-11-23 MED ORDER — THROMBIN 5000 UNITS EX SOLR
CUTANEOUS | Status: DC | PRN
  Administered 2011-11-23 (×2): 5000 [IU] via TOPICAL

## 2011-11-23 MED ORDER — ACETAMINOPHEN 325 MG PO TABS: 650.0000 mg | ORAL_TABLET | ORAL | Status: DC | PRN

## 2011-11-23 MED ORDER — NEOSTIGMINE METHYLSULFATE 1 MG/ML IJ SOLN
INTRAMUSCULAR | Status: DC | PRN
  Administered 2011-11-23: 4 mg via INTRAVENOUS

## 2011-11-23 SURGICAL SUPPLY — 61 items
APL SKNCLS STERI-STRIP NONHPOA (GAUZE/BANDAGES/DRESSINGS) ×1
BAG DECANTER FOR FLEXI CONT (MISCELLANEOUS) ×2 IMPLANT
BENZOIN TINCTURE PRP APPL 2/3 (GAUZE/BANDAGES/DRESSINGS) ×3 IMPLANT
BIT DRILL SPINE QC 14 (BIT) ×1 IMPLANT
BLADE SURG 15 STRL LF DISP TIS (BLADE) ×1 IMPLANT
BLADE SURG 15 STRL SS (BLADE) ×2
BLADE ULTRA TIP 2M (BLADE) ×2 IMPLANT
BRUSH SCRUB EZ PLAIN DRY (MISCELLANEOUS) ×2 IMPLANT
BUR BARREL STRAIGHT FLUTE 4.0 (BURR) ×2 IMPLANT
BUR MATCHSTICK NEURO 3.0 LAGG (BURR) ×4 IMPLANT
CANISTER SUCTION 2500CC (MISCELLANEOUS) ×2 IMPLANT
CLOTH BEACON ORANGE TIMEOUT ST (SAFETY) ×2 IMPLANT
CONT SPEC 4OZ CLIKSEAL STRL BL (MISCELLANEOUS) ×2 IMPLANT
COVER MAYO STAND STRL (DRAPES) ×2 IMPLANT
DRAIN JACKSON PRATT 10MM FLAT (MISCELLANEOUS) ×1 IMPLANT
DRAPE LAPAROTOMY 100X72 PEDS (DRAPES) ×2 IMPLANT
DRAPE MICROSCOPE LEICA (MISCELLANEOUS) IMPLANT
DRAPE POUCH INSTRU U-SHP 10X18 (DRAPES) ×2 IMPLANT
DRAPE SURG 17X23 STRL (DRAPES) ×4 IMPLANT
ELECT REM PT RETURN 9FT ADLT (ELECTROSURGICAL) ×2
ELECTRODE REM PT RTRN 9FT ADLT (ELECTROSURGICAL) ×1 IMPLANT
EVACUATOR SILICONE 100CC (DRAIN) ×1 IMPLANT
GAUZE SPONGE 4X4 16PLY XRAY LF (GAUZE/BANDAGES/DRESSINGS) IMPLANT
GLOVE BIO SURGEON STRL SZ8.5 (GLOVE) ×2 IMPLANT
GLOVE ECLIPSE 7.5 STRL STRAW (GLOVE) ×5 IMPLANT
GLOVE EXAM NITRILE LRG STRL (GLOVE) IMPLANT
GLOVE EXAM NITRILE MD LF STRL (GLOVE) IMPLANT
GLOVE EXAM NITRILE XL STR (GLOVE) IMPLANT
GLOVE EXAM NITRILE XS STR PU (GLOVE) IMPLANT
GLOVE INDICATOR 7.5 STRL GRN (GLOVE) ×1 IMPLANT
GLOVE INDICATOR 8.0 STRL GRN (GLOVE) ×1 IMPLANT
GLOVE SS BIOGEL STRL SZ 8 (GLOVE) ×1 IMPLANT
GLOVE SUPERSENSE BIOGEL SZ 8 (GLOVE) ×1
GOWN BRE IMP SLV AUR LG STRL (GOWN DISPOSABLE) IMPLANT
GOWN BRE IMP SLV AUR XL STRL (GOWN DISPOSABLE) IMPLANT
KIT BASIN OR (CUSTOM PROCEDURE TRAY) ×2 IMPLANT
KIT ROOM TURNOVER OR (KITS) ×2 IMPLANT
MARKER SKIN DUAL TIP RULER LAB (MISCELLANEOUS) ×2 IMPLANT
NDL SPNL 18GX3.5 QUINCKE PK (NEEDLE) ×1 IMPLANT
NEEDLE HYPO 22GX1.5 SAFETY (NEEDLE) ×2 IMPLANT
NEEDLE SPNL 18GX3.5 QUINCKE PK (NEEDLE) ×2 IMPLANT
NS IRRIG 1000ML POUR BTL (IV SOLUTION) ×2 IMPLANT
PACK LAMINECTOMY NEURO (CUSTOM PROCEDURE TRAY) ×2 IMPLANT
PATTIES SURGICAL .5 X.5 (GAUZE/BANDAGES/DRESSINGS) ×1 IMPLANT
PEEK VISTA 14X14X8MM (Peek) ×2 IMPLANT
PIN DISTRACTION 14MM (PIN) ×4 IMPLANT
PLATE ANT CERV XTEND 2 LV (Plate) ×1 IMPLANT
PUTTY 5ML ACTIFUSE ABX (Putty) ×2 IMPLANT
RUBBERBAND STERILE (MISCELLANEOUS) IMPLANT
SCREW XTD VAR 4.2 SELF TAP (Screw) ×6 IMPLANT
SPONGE GAUZE 4X4 12PLY (GAUZE/BANDAGES/DRESSINGS) ×2 IMPLANT
SPONGE INTESTINAL PEANUT (DISPOSABLE) ×4 IMPLANT
SPONGE SURGIFOAM ABS GEL SZ50 (HEMOSTASIS) ×2 IMPLANT
STRIP CLOSURE SKIN 1/2X4 (GAUZE/BANDAGES/DRESSINGS) ×2 IMPLANT
SUT VIC AB 0 CT1 27 (SUTURE) ×2
SUT VIC AB 0 CT1 27XBRD ANTBC (SUTURE) ×1 IMPLANT
SUT VIC AB 3-0 SH 8-18 (SUTURE) ×2 IMPLANT
SYR 20ML ECCENTRIC (SYRINGE) ×2 IMPLANT
TOWEL OR 17X24 6PK STRL BLUE (TOWEL DISPOSABLE) ×2 IMPLANT
TOWEL OR 17X26 10 PK STRL BLUE (TOWEL DISPOSABLE) ×2 IMPLANT
WATER STERILE IRR 1000ML POUR (IV SOLUTION) ×2 IMPLANT

## 2011-11-23 NOTE — Plan of Care (Signed)
Problem: Consults Goal: Diagnosis - Spinal Surgery Outcome: Completed/Met Date Met:  11/23/11 Cervical Spine Fusion

## 2011-11-23 NOTE — Preoperative (Signed)
Beta Blockers   Reason not to administer Beta Blockers:Not Applicable 

## 2011-11-23 NOTE — Anesthesia Preprocedure Evaluation (Addendum)
Anesthesia Evaluation  Patient identified by MRN, date of birth, ID band Patient awake    Reviewed: Allergy & Precautions, H&P , NPO status , Patient's Chart, lab work & pertinent test results  History of Anesthesia Complications Negative for: history of anesthetic complications  Airway Mallampati: I TM Distance: >3 FB Neck ROM: Full    Dental  (+) Teeth Intact and Dental Advisory Given   Pulmonary Current Smoker,          Cardiovascular negative cardio ROS      Neuro/Psych negative neurological ROS     GI/Hepatic negative GI ROS, (+)     substance abuse  marijuana use, Hepatitis -, B  Endo/Other  negative endocrine ROS  Renal/GU negative Renal ROS     Musculoskeletal negative musculoskeletal ROS (+)   Abdominal   Peds  Hematology negative hematology ROS (+)   Anesthesia Other Findings   Reproductive/Obstetrics negative OB ROS                         Anesthesia Physical Anesthesia Plan  ASA: II  Anesthesia Plan: General   Post-op Pain Management:    Induction: Intravenous  Airway Management Planned: Oral ETT  Additional Equipment:   Intra-op Plan:   Post-operative Plan: Extubation in OR  Informed Consent: I have reviewed the patients History and Physical, chart, labs and discussed the procedure including the risks, benefits and alternatives for the proposed anesthesia with the patient or authorized representative who has indicated his/her understanding and acceptance.     Plan Discussed with: CRNA and Surgeon  Anesthesia Plan Comments:         Anesthesia Quick Evaluation

## 2011-11-23 NOTE — Transfer of Care (Signed)
Immediate Anesthesia Transfer of Care Note  Patient: Timothy Hardin  Procedure(s) Performed: Procedure(s) (LRB) with comments: ANTERIOR CERVICAL DECOMPRESSION/DISCECTOMY FUSION 2 LEVELS (N/A) - Cervical four-five Cervical five-six anterior cervical decompression with fusion interbody prothesis plating and bonegraft  Patient Location: PACU  Anesthesia Type: General  Level of Consciousness: awake, alert , oriented and sedated  Airway & Oxygen Therapy: Patient Spontanous Breathing and Patient connected to nasal cannula oxygen  Post-op Assessment: Report given to PACU RN, Post -op Vital signs reviewed and stable and Patient moving all extremities  Post vital signs: Reviewed and stable  Complications: No apparent anesthesia complications

## 2011-11-23 NOTE — Anesthesia Postprocedure Evaluation (Signed)
Anesthesia Post Note  Patient: Timothy Hardin  Procedure(s) Performed: Procedure(s) (LRB): ANTERIOR CERVICAL DECOMPRESSION/DISCECTOMY FUSION 2 LEVELS (N/A)  Anesthesia type: general  Patient location: PACU  Post pain: Pain level controlled  Post assessment: Patient's Cardiovascular Status Stable  Last Vitals:  Filed Vitals:   11/23/11 1426  BP: 159/83  Pulse: 59  Temp:   Resp: 13    Post vital signs: Reviewed and stable  Level of consciousness: sedated  Complications: No apparent anesthesia complications

## 2011-11-23 NOTE — H&P (Signed)
Subjective: The patient is a 46 year old white male who has complained of neck and left arm pain. He has failed medical management and was worked up with a cervical MRI. This demonstrates spondylosis and stenosis most prominent  at C4-5 and C5-6. I discussed the various treatment options with the patient including surgery. The patient has weighed the risks, benefits, and alternatives surgery decided proceed with a C4-5 and C5-6 anterior cervicectomy, fusion, and plating.  Past Medical History  Diagnosis Date  . Arthritis     Past Surgical History  Procedure Date  . Mandible surgery 25 years ago    reduction  . Tonsillectomy     Allergies  Allergen Reactions  . Meperidine Hcl     REACTION: hives  . Hydrocodone-Acetaminophen Nausea Only    Intolerance    History  Substance Use Topics  . Smoking status: Never Smoker   . Smokeless tobacco: Not on file  . Alcohol Use:      daily    Family History  Problem Relation Age of Onset  . Adopted: Yes   Prior to Admission medications   Medication Sig Start Date End Date Taking? Authorizing Provider  CALCIUM-VITAMIN D PO Take 1 tablet by mouth daily.   Yes Historical Provider, MD  fish oil-omega-3 fatty acids 1000 MG capsule Take 1 g by mouth daily.   Yes Historical Provider, MD  simvastatin (ZOCOR) 20 MG tablet Take 20 mg by mouth every evening.   Yes Historical Provider, MD     Review of Systems  Positive ROS: As above  All other systems have been reviewed and were otherwise negative with the exception of those mentioned in the HPI and as above.  Objective: Vital signs in last 24 hours: Temp:  [98.4 F (36.9 C)] 98.4 F (36.9 C) (10/14 0720) Pulse Rate:  [71] 71  (10/14 0720) Resp:  [18] 18  (10/14 0720) BP: (155)/(93) 155/93 mmHg (10/14 0720) SpO2:  [100 %] 100 % (10/14 0720)  General Appearance: Alert, cooperative, no distress, appears stated age Head: Normocephalic, without obvious abnormality, atraumatic Eyes: PERRL,  conjunctiva/corneas clear, EOM's intact, fundi benign, both eyes      Ears: Normal TM's and external ear canals, both ears Throat: Lips, mucosa, and tongue normal; teeth and gums normal Neck: Supple, symmetrical, trachea midline, no adenopathy; thyroid: No enlargement/tenderness/nodules; no carotid bruit or JVD. Spurling's testing is positive  on the left and negative on the right. Back: Symmetric, no curvature, ROM normal, no CVA tenderness Lungs: Clear to auscultation bilaterally, respirations unlabored Heart: Regular rate and rhythm, S1 and S2 normal, no murmur, rub or gallop Abdomen: Soft, non-tender, bowel sounds active all four quadrants, no masses, no organomegaly Extremities: Extremities normal, atraumatic, no cyanosis or edema Pulses: 2+ and symmetric all extremities Skin: Skin color, texture, turgor normal, no rashes or lesions  NEUROLOGIC:   Mental status: alert and oriented, no aphasia, good attention span, Fund of knowledge/ memory ok Motor Exam - grossly normal Sensory Exam - grossly normal except the patient has decreased light-touch sensation in the bilateral C6 distribution. Reflexes: Symmetric Coordination - grossly normal Gait - grossly normal Balance - grossly normal Cranial Nerves: I: smell Not tested  II: visual acuity  OS: Normal    OD: Normal   II: visual fields Full to confrontation  II: pupils Equal, round, reactive to light  III,VII: ptosis None  III,IV,VI: extraocular muscles  Full ROM  V: mastication Normal  V: facial light touch sensation  Normal  V,VII: corneal  reflex  Present  VII: facial muscle function - upper  Normal  VII: facial muscle function - lower Normal  VIII: hearing Not tested  IX: soft palate elevation  Normal  IX,X: gag reflex Present  XI: trapezius strength  5/5  XI: sternocleidomastoid strength 5/5  XI: neck flexion strength  5/5  XII: tongue strength  Normal    Data Review Lab Results  Component Value Date   WBC 8.4  11/17/2011   HGB 14.1 11/17/2011   HCT 40.8 11/17/2011   MCV 91.1 11/17/2011   PLT 218 11/17/2011   Lab Results  Component Value Date   NA 139 11/17/2011   K 3.9 11/17/2011   CL 102 11/17/2011   CO2 28 11/17/2011   BUN 17 11/17/2011   CREATININE 0.91 11/17/2011   GLUCOSE 87 11/17/2011   No results found for this basename: INR, PROTIME    Assessment/Plan: 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 I have reviewed the MR scan with them and pointed out the abnormalities. We have discussed the various treatment options including a C4-5 and C5-6 intracervical second, fusion, and plating. I have described the surgery to him. I've shown surgical models. We have discussed the risks, benefits, alternatives, and likelihood of achieving our goals with surgery. I have answered all the patient's questions. He has decided to proceed with surgery.   Santosh Petter D 11/23/2011 9:23 AM

## 2011-11-23 NOTE — Progress Notes (Signed)
Subjective:  The patient is alert and pleasant. He looks well. He is in no apparent distress.  Objective: Vital signs in last 24 hours: Temp:  [96.9 F (36.1 C)-98.4 F (36.9 C)] 96.9 F (36.1 C) (10/14 1325) Pulse Rate:  [68-71] 68  (10/14 1325) Resp:  [18-20] 20  (10/14 1325) BP: (155-158)/(91-93) 158/91 mmHg (10/14 1325) SpO2:  [100 %] 100 % (10/14 1325)  Intake/Output from previous day:   Intake/Output this shift: Total I/O In: 2200 [I.V.:2200] Out: 100 [Blood:100]  Physical exam the patient is alert and pleasant. His strength is 5 over 5 his bile deltoid biceps and lower extremities. His dressing is clean and dry. There is no evidence of hematoma or shift.  Lab Results: No results found for this basename: WBC:2,HGB:2,HCT:2,PLT:2 in the last 72 hours BMET No results found for this basename: NA:2,K:2,CL:2,CO2:2,GLUCOSE:2,BUN:2,CREATININE:2,CALCIUM:2 in the last 72 hours  Studies/Results: No results found.  Assessment/Plan: The patient is doing well.  LOS: 0 days     Byron Tipping D 11/23/2011, 1:34 PM

## 2011-11-23 NOTE — Op Note (Signed)
Brief history: The patient is a 46 year old white male who has suffered from neck and arm pain consistent with a cervical radiculopathy. He has failed medical management and was worked up with a cervical MRI. This demonstrated patient had significant disc degeneration and spondylosis/stenosis at C4-5 and C5-6. I discussed the various treatment option with the patient including surgery. The patient has weighed the risks, benefits, and alternatives surgery and decided proceed with a C4-5 and C5-6 anterior cervicectomy fusion and plating.  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 Delma Officer  Asst.: Dr. Mardelle Matte pool  Anesthesia: Gen. endotracheal  Estimated blood loss: 150 cc  Drains: None  Complications: None  Description of procedure: The patient was brought to the operating room by the anesthesia team. General endotracheal anesthesia was induced. A roll was placed under the patient's shoulders to keep the neck in the neutral position. The patient's anterior cervical region was then prepared with Betadine scrub and Betadine solution. Sterile drapes were applied.  The area to be incised was then injected with Marcaine with epinephrine solution. I then used a scalpel to make a transverse incision in the patient's left anterior neck. I used the Metzenbaum scissors to divide the platysmal muscle and then to dissect medial to the sternocleidomastoid muscle, jugular vein, and carotid artery. I carefully dissected down towards the anterior  cervical spine identifying the esophagus and retracting it medially. Then using Kitner swabs to clear soft tissue from the anterior cervical spine. We then inserted a bent spinal needle into the upper exposed intervertebral disc space. We then obtained intraoperative radiographs confirm our location.  I then used electrocautery to detach the medial border of the longus colli muscle bilaterally from the C4-5 and C5-6 intervertebral disc spaces. I then inserted the Caspar self-retaining retractor underneath the longus colli muscle bilaterally to provide exposure.  We then incised the intervertebral disc at C4-5. We then performed a partial intervertebral discectomy with a pituitary forceps and the Karlin curettes. I then inserted distraction screws into the vertebral bodies at C4 and C5. We then distracted the interspace. We then used the high-speed drill to decorticate the vertebral endplates at C4-5, to drill away the remainder of the intervertebral disc, to drill away some posterior spondylosis, and to thin out the posterior longitudinal ligament. I then incised ligament with the arachnoid knife. We then removed the ligament with a Kerrison punches undercutting the vertebral endplates and decompressing the thecal sac. We then performed foraminotomies about the bilateral C5 nerve roots. This completed the decompression at this level.  We then repeated this procedure in an analogous fashion at C5-C6, decompressing the thecal sac and the bilateral C6 nerve roots.  We now turned our to attention to the interbody fusion. We used the trial spacers to determine the appropriate size for the interbody prosthesis. We then pre-filled prosthesis with a combination of local morcellized autograft bone that we obtained during decompression as well as Actifuse bone graft extender. We then inserted the prosthesis into the distracted interspace at C4-5 and C5-6. We then removed the distraction screws. There was a good snug  fit of the prosthesis in the interspace.   Having completed  the fusion we now turned attention to the anterior spinal instrumentation. We used the high-speed drill to drill away some anterior spondylosis at the disc spaces so that the plate lay down flat. We selected the appropriate length titanium anterior cervical plate. We laid it along the anterior aspect of the vertebral bodies from C4-C6. We then drilled 14 mm holes at C4, C5 and C6. We then secured the plate to the vertebral bodies by placing two 14 mm self-tapping screws at C4, C5 and C6. We then obtained intraoperative radiograph. The demonstrating good position of the instrumentation. We therefore secured the screws the plate the locking each cam. This completed the instrumentation. I then placed a 10 mm flat Jackson-Pratt drain in the prevertebral space. We tunneled it out through separate stab wound. And secured the drain at the exit site with a 3-0 Vicryl suture  We then obtained hemostasis using bipolar electrocautery. We irrigated the wound out with bacitracin solution. We then removed the retractor. We inspected the esophagus for any damage. There was none apparent. We then reapproximated patient's platysmal muscle with interrupted 3-0 Vicryl suture. We then reapproximated the subcutaneous tissue with interrupted 3-0 Vicryl suture. The skin was reapproximated with Steri-Strips and benzoin. The wound was then covered with bacitracin ointment. A sterile dressing was applied. The drapes were removed. Patient was subsequently extubated by the anesthesia team and transported to the post anesthesia care unit in stable condition. All sponge instrument and needle counts were reportedly correct at the end of this case.

## 2011-11-23 NOTE — Progress Notes (Signed)
Patient ID: Timothy Hardin, male   DOB: Aug 17, 1965, 46 y.o.   MRN: [id number] Subjective:  The patient is alert and oriented. He looks well. He is in no apparent distress.  Objective: Vital signs in last 24 hours: Temp:  [96.9 F (36.1 C)-98.5 F (36.9 C)] 98.5 F (36.9 C) (10/14 1659) Pulse Rate:  [53-80] 80  (10/14 1659) Resp:  [12-20] 20  (10/14 1659) BP: (122-174)/(83-94) 122/85 mmHg (10/14 1659) SpO2:  [98 %-100 %] 98 % (10/14 1659)  Intake/Output from previous day:   Intake/Output this shift: Total I/O In: 2810 [P.O.:600; I.V.:2200; Other:10] Out: 100 [Blood:100]  Physical exam the patient is alert and oriented. His strength is grossly normal in all 4 extremities. His dressing is clean and dry. There is no evidence of hematoma or shift.  Lab Results: No results found for this basename: WBC:2,HGB:2,HCT:2,PLT:2 in the last 72 hours BMET No results found for this basename: NA:2,K:2,CL:2,CO2:2,GLUCOSE:2,BUN:2,CREATININE:2,CALCIUM:2 in the last 72 hours  Studies/Results: Dg Cervical Spine 2-3 Views  11/23/2011  *RADIOLOGY REPORT*  Clinical Data: Neck pain  CERVICAL SPINE - 2-3 VIEW  Comparison: None.  Findings: Portable film #1 demonstrates a needle at C4-C5.  Film #2 demonstrates C4-C6 ACDF.  IMPRESSION: As above.   Original Report Authenticated By: Elsie Stain, M.D.     Assessment/Plan: The patient is doing well. He will likely go home tomorrow.  LOS: 0 days     [name] D 11/23/2011, 5:18 PM

## 2011-11-24 MED ORDER — DSS 100 MG PO CAPS: 100.0000 mg | ORAL_CAPSULE | Freq: Two times a day (BID) | ORAL | Status: DC

## 2011-11-24 MED ORDER — HYDROMORPHONE HCL 4 MG PO TABS: 4.0000 mg | ORAL_TABLET | ORAL | Status: DC | PRN

## 2011-11-24 MED ORDER — DIAZEPAM 5 MG PO TABS: 5.0000 mg | ORAL_TABLET | Freq: Four times a day (QID) | ORAL | Status: DC | PRN

## 2011-11-24 NOTE — Progress Notes (Signed)
Utilization review completed. Mouhamed Glassco, RN, BSN. 

## 2011-11-24 NOTE — Progress Notes (Signed)
Pt and wife given D/C instructions with Rx's. Pt verbalized understanding of teaching. Pt D/C'd home via wheelchair with wife per MD order. Rema Fendt, RN

## 2011-11-24 NOTE — Discharge Summary (Signed)
  Physician Discharge Summary  Patient ID: Timothy Hardin MRN: [id number] DOB/AGE: 1965/03/30 46 y.o.  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 *    Discharged Condition: good  Hospital Course: I admitted the patient to [facility] [place] on 11/23/2011. On that day I performed a C4-5 and C5-6 anterior cervicectomy, fusion, and plating. The surgery were well.  The patient's postoperative course was unremarkable by postop day #1 he was requesting discharge to home. The patient was given oral and written discharge instructions all his questions were answered.  Consults: None Significant Diagnostic Studies: None Treatments: C4-5 and C5-6 intracervical discectomy, fusion and plating. Discharge Exam: Blood pressure 129/81, pulse 75, temperature 98.4 F (36.9 C), temperature source Oral, resp. rate 16, SpO2 100.00%. The patient is alert and pleasant. He looks well. His dressing is clean and dry. There is no evidence of hematoma or shift. I removed the drain. His strength is 5 over 5 his bilateral deltoid biceps and lower extremities.  Disposition: Home  Discharge Orders    Future Orders Please Complete By Expires   Diet - low sodium heart healthy      Increase activity slowly      Discharge instructions      Comments:   Call [phone] for a followup appointment.   Remove dressing in 48 hours      Call MD for:  temperature >100.4      Call MD for:  persistant nausea and vomiting      Call MD for:  severe uncontrolled pain      Call MD for:  redness, tenderness, or signs of infection (pain, swelling, redness, odor or green/yellow discharge around incision site)      Call MD for:  difficulty breathing, headache or visual disturbances      Call MD for:   hives      Call MD for:  persistant dizziness or light-headedness      Call MD for:  extreme fatigue          Medication List     As of 11/24/2011  7:28 AM    TAKE these medications         CALCIUM-VITAMIN D PO   Take 1 tablet by mouth daily.      diazepam 5 MG tablet   Commonly known as: VALIUM   Take 1 tablet (5 mg total) by mouth every 6 (six) hours as needed.      DSS 100 MG Caps   Take 100 mg by mouth 2 (two) times daily.      fish oil-omega-3 fatty acids 1000 MG capsule   Take 1 g by mouth daily.      HYDROmorphone 4 MG tablet   Commonly known as: DILAUDID   Take 1 tablet (4 mg total) by mouth every 4 (four) hours as needed.      simvastatin 20 MG tablet   Commonly known as: ZOCOR   Take 20 mg by mouth every evening.         SignedCristi Loron 11/24/2011, 7:28 AM

## 2011-11-24 NOTE — Progress Notes (Signed)
Orthopedic Tech Progress Note Patient Details:  Timothy Hardin May 02, 1965 [id number]       [name] 11/24/2011, 3:18 PM SOFT COLLAR COMPLETED BY [facility]

## 2011-11-25 ENCOUNTER — Encounter (HOSPITAL_COMMUNITY): Payer: Self-pay | Admitting: Neurosurgery

## 2012-01-14 ENCOUNTER — Telehealth: Payer: Self-pay | Admitting: Family Medicine

## 2012-01-14 NOTE — Telephone Encounter (Signed)
Pt has fup on 12/09 and would like CPX labs this visit. Can we schedule?

## 2012-01-15 NOTE — Telephone Encounter (Signed)
Ok to do labs.

## 2012-01-18 ENCOUNTER — Ambulatory Visit (INDEPENDENT_AMBULATORY_CARE_PROVIDER_SITE_OTHER): Payer: 59 | Admitting: Family Medicine

## 2012-01-18 ENCOUNTER — Encounter: Payer: Self-pay | Admitting: Family Medicine

## 2012-01-18 ENCOUNTER — Other Ambulatory Visit: Payer: Self-pay | Admitting: Family Medicine

## 2012-01-18 VITALS — BP 110/70 | Temp 98.2°F | Ht 69.0 in | Wt 167.0 lb

## 2012-01-18 DIAGNOSIS — E785 Hyperlipidemia, unspecified: Secondary | ICD-10-CM

## 2012-01-18 DIAGNOSIS — L408 Other psoriasis: Secondary | ICD-10-CM

## 2012-01-18 DIAGNOSIS — R945 Abnormal results of liver function studies: Secondary | ICD-10-CM

## 2012-01-18 LAB — HEPATIC FUNCTION PANEL
ALT: 62 U/L — ABNORMAL HIGH (ref 0–53)
AST: 47 U/L — ABNORMAL HIGH (ref 0–37)
Albumin: 4.5 g/dL (ref 3.5–5.2)
Alkaline Phosphatase: 57 U/L (ref 39–117)
Bilirubin, Direct: 0.1 mg/dL (ref 0.0–0.3)
Total Bilirubin: 0.9 mg/dL (ref 0.3–1.2)
Total Protein: 7.9 g/dL (ref 6.0–8.3)

## 2012-01-18 LAB — POCT URINALYSIS DIPSTICK
Bilirubin, UA: NEGATIVE
Glucose, UA: NEGATIVE
Ketones, UA: NEGATIVE
Leukocytes, UA: NEGATIVE
Nitrite, UA: NEGATIVE
Protein, UA: NEGATIVE
Spec Grav, UA: >=1.03
Urobilinogen, UA: 0.2
pH, UA: 5

## 2012-01-18 LAB — CBC WITH DIFFERENTIAL/PLATELET
Basophils Absolute: 0 10*3/uL (ref 0.0–0.1)
Basophils Relative: 0.2 % (ref 0.0–3.0)
Eosinophils Absolute: 0 10*3/uL (ref 0.0–0.7)
Eosinophils Relative: 0.5 % (ref 0.0–5.0)
HCT: 42.5 % (ref 39.0–52.0)
Hemoglobin: 14.3 g/dL (ref 13.0–17.0)
Lymphocytes Relative: 18.6 % (ref 12.0–46.0)
Lymphs Abs: 1.6 10*3/uL (ref 0.7–4.0)
MCHC: 33.5 g/dL (ref 30.0–36.0)
MCV: 92.1 fl (ref 78.0–100.0)
Monocytes Absolute: 0.8 10*3/uL (ref 0.1–1.0)
Monocytes Relative: 9.4 % (ref 3.0–12.0)
Neutro Abs: 6.1 10*3/uL (ref 1.4–7.7)
Neutrophils Relative %: 71.3 % (ref 43.0–77.0)
Platelets: 249 10*3/uL (ref 150.0–400.0)
RBC: 4.62 Mil/uL (ref 4.22–5.81)
RDW: 12.5 % (ref 11.5–14.6)
WBC: 8.6 10*3/uL (ref 4.5–10.5)

## 2012-01-18 LAB — BASIC METABOLIC PANEL
BUN: 15 mg/dL (ref 6–23)
CO2: 27 mEq/L (ref 19–32)
Calcium: 9.4 mg/dL (ref 8.4–10.5)
Chloride: 101 mEq/L (ref 96–112)
Creatinine, Ser: 0.9 mg/dL (ref 0.4–1.5)
GFR: 102.48 mL/min (ref >60.00)
Glucose, Bld: 97 mg/dL (ref 70–99)
Potassium: 3.5 mEq/L (ref 3.5–5.1)
Sodium: 136 mEq/L (ref 135–145)

## 2012-01-18 LAB — LIPID PANEL
Cholesterol: 234 mg/dL — ABNORMAL HIGH (ref 0–200)
HDL: 95.4 mg/dL (ref >39.00)
Total CHOL/HDL Ratio: 2
Triglycerides: 182 mg/dL — ABNORMAL HIGH (ref 0.0–149.0)
VLDL: 36.4 mg/dL (ref 0.0–40.0)

## 2012-01-18 LAB — TSH: TSH: 1.2 u[IU]/mL (ref 0.35–5.50)

## 2012-01-18 MED ORDER — INDOMETHACIN 50 MG PO CAPS: 50.0000 mg | ORAL_CAPSULE | Freq: Two times a day (BID) | ORAL | Status: DC

## 2012-01-18 NOTE — Patient Instructions (Signed)
I would call Dr. Lovell Sheehan office to discuss physical therapy for your neck  Begin indomethacin........ one twice daily with food  A call you with your lab reports

## 2012-01-18 NOTE — Progress Notes (Signed)
  Subjective:    Patient ID: Timothy Hardin, male    DOB: 1965-09-11, 46 y.o.   MRN: [id number]  HPI Timothy Hardin is a 46 year old single male nonsmoker who comes in today for evaluation of joint pain and to discuss hepatitis  He states when he was 1 months old he was diagnosed with anemia and was transfused 2 units of blood. Subsequent to that his hep B became positive. He's been asymptomatic but would like a further evaluation. He's maintained a low-grade level of abnormal LFTs since that time. He does not drink nor smoke.  In October he had cervical disc surgery by Dr. Lovell Sheehan. He had bone spurs and spinal stenosis. Plates and hardware were inserted. He's done well postop and neck-wise he feels much improved  Followup x-rays show normal alignment.  He has a history of psoriasis and off and on he says some arthritic pain however recently it seems to have gotten worse. He's diffusely sore. No redness.   Review of Systems    general review of systems otherwise negative Objective:   Physical Exam Well-developed well-nourished male in no distress. Examination neck shows a scar in the left lateral portion of the neck from previous anterior approach for neck surgery that was done in October. The scar is well-healed. Abdominal exam is negative cardiopulmonary exam normal musculoskeletal exam normal       Assessment & Plan:  Psoriasis  Psoriatic arthritis begin indomethacin 50 mg twice a day with food check labs  Status post neck surgery question physical therapy referred back to Dr. 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

## 2012-01-19 LAB — HEPATITIS B SURFACE ANTIGEN: Hepatitis B Surface Ag: NEGATIVE

## 2012-01-19 LAB — LDL CHOLESTEROL, DIRECT: Direct LDL: 109.7 mg/dL

## 2012-01-19 LAB — HEPATITIS B SURFACE ANTIBODY,QUALITATIVE: Hep B S Ab: NONREACTIVE

## 2012-01-22 LAB — HEPATITIS C ANTIBODY: HCV Ab: NEGATIVE

## 2012-01-29 ENCOUNTER — Other Ambulatory Visit: Payer: Self-pay | Admitting: Family Medicine

## 2012-02-08 ENCOUNTER — Telehealth: Payer: Self-pay | Admitting: Family Medicine

## 2012-02-08 DIAGNOSIS — R899 Unspecified abnormal finding in specimens from other organs, systems and tissues: Secondary | ICD-10-CM

## 2012-02-08 NOTE — Telephone Encounter (Signed)
Pt is ready for you to make appt with GI doctor for him.

## 2012-03-11 ENCOUNTER — Ambulatory Visit (INDEPENDENT_AMBULATORY_CARE_PROVIDER_SITE_OTHER): Payer: 59 | Admitting: Internal Medicine

## 2012-03-11 ENCOUNTER — Other Ambulatory Visit (INDEPENDENT_AMBULATORY_CARE_PROVIDER_SITE_OTHER): Payer: 59

## 2012-03-11 ENCOUNTER — Encounter: Payer: Self-pay | Admitting: Internal Medicine

## 2012-03-11 ENCOUNTER — Other Ambulatory Visit: Payer: Self-pay

## 2012-03-11 VITALS — BP 100/64 | HR 80 | Ht 68.0 in | Wt 171.4 lb

## 2012-03-11 DIAGNOSIS — R7989 Other specified abnormal findings of blood chemistry: Secondary | ICD-10-CM

## 2012-03-11 DIAGNOSIS — R748 Abnormal levels of other serum enzymes: Secondary | ICD-10-CM

## 2012-03-11 DIAGNOSIS — R1013 Epigastric pain: Secondary | ICD-10-CM

## 2012-03-11 DIAGNOSIS — R7402 Elevation of levels of lactic acid dehydrogenase (LDH): Secondary | ICD-10-CM

## 2012-03-11 DIAGNOSIS — R945 Abnormal results of liver function studies: Secondary | ICD-10-CM

## 2012-03-11 LAB — CK: Total CK: 128 U/L (ref 7–232)

## 2012-03-11 LAB — HEPATIC FUNCTION PANEL
ALT: 57 U/L — ABNORMAL HIGH (ref 0–53)
AST: 42 U/L — ABNORMAL HIGH (ref 0–37)
Albumin: 4.3 g/dL (ref 3.5–5.2)
Alkaline Phosphatase: 58 U/L (ref 39–117)
Bilirubin, Direct: 0.2 mg/dL (ref 0.0–0.3)
Total Bilirubin: 1 mg/dL (ref 0.3–1.2)
Total Protein: 7.8 g/dL (ref 6.0–8.3)

## 2012-03-11 LAB — LIPASE: Lipase: 44 U/L (ref 11.0–59.0)

## 2012-03-11 LAB — AMYLASE: Amylase: 68 U/L (ref 27–131)

## 2012-03-11 LAB — FERRITIN: Ferritin: 117.1 ng/mL (ref 22.0–322.0)

## 2012-03-11 MED ORDER — ESOMEPRAZOLE MAGNESIUM 40 MG PO CPDR: 40.0000 mg | DELAYED_RELEASE_CAPSULE | Freq: Every day | ORAL | Status: DC

## 2012-03-11 NOTE — Progress Notes (Signed)
Add on form faxed to lab to order Amylase and Lipase per Dr. Leone Payor.

## 2012-03-11 NOTE — Progress Notes (Signed)
Subjective:    Patient ID: Timothy Hardin, male    DOB: 1965-07-15, 47 y.o.   MRN: [id number]  HPI This man presents with a friend for evaluation of abnormal transaminases. He has a hx of abnormal transaminases and at one point it was surmised that he had contracted hepatitis B from a blood transfusion as an infant. However he is Hep B S Ag and Ab negative. He does drink a 6 pack of beer daily x years, without known problems. He is also complaining of a several month hx of epigastric pain and tenderness, point localization in the xiphoid area. Food or movement do not bother this and he is still able to exercise without bothering this, including abdominal crunches. Not much if any heartburn thoufgh did have in the past. No NSAID use.  He underwent cervical spine surgery in the fall and since then has had dysphagia that has greatly improved and is now very rare - to solids.  Had loose stools yesterday but generally without bowel habit changes.  Allergies  Allergen Reactions  . Meperidine Hcl     REACTION: hives  . Hydrocodone-Acetaminophen Nausea Only    Intolerance   Outpatient Prescriptions Prior to Visit  Medication Sig Dispense Refill  . CALCIUM-VITAMIN D PO Take 1 tablet by mouth daily.      . simvastatin (ZOCOR) 20 MG tablet Take 20 mg by mouth every evening.      . [DISCONTINUED] HYDROmorphone (DILAUDID) 4 MG tablet Take 1 tablet (4 mg total) by mouth every 4 (four) hours as needed.  100 tablet  0  . [DISCONTINUED] indomethacin (INDOCIN) 50 MG capsule Take 1 capsule (50 mg total) by mouth 2 (two) times daily with a meal.  200 capsule  3  . [DISCONTINUED] simvastatin (ZOCOR) 20 MG tablet TAKE 1 TABLET BY MOUTH AT BEDTIME  100 tablet  2   Last reviewed on 03/11/2012  4:18 PM by Iva Boop, MD Past Medical History  Diagnosis Date  . Arthritis   . Anemia     dx 6 mos. old  . Hyperlipidemia   . Psoriasis   . Spinal stenosis in cervical region   . Elevated liver function tests     Past Surgical History  Procedure Date  . Mandible surgery 25 years ago    reduction  . Tonsillectomy   . Anterior cervical decomp/discectomy fusion 11/23/2011    Procedure: ANTERIOR CERVICAL DECOMPRESSION/DISCECTOMY FUSION 2 LEVELS;  Surgeon: Cristi Loron, MD;  Location: [facility];  Service: Neurosurgery;  Laterality: N/A;  Cervical four-five Cervical five-six anterior cervical decompression with fusion interbody prothesis plating and bonegraft   History   Social History  . Marital Status: Single    Spouse Name: N/A    Number of Children: N/A  . Years of Education: N/A   Social History Main Topics  . Smoking status: Never Smoker   . Smokeless tobacco: Never Used  . Alcohol Use: 25.2 oz/week    42 Cans of beer per week     Comment: 6 daily  . Drug Use: 7 per week    Special: Marijuana     Comment: daily    Social History Narrative   Divorced, 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[place] and [employer] restaraunt   Family History  Problem Relation Age of Onset  . Adopted: Yes      Review of Systems As per HPI, arthritis pain (arthralgia), active psoriasis - not on therapy presently    Objective:   Physical Exam  General:  Well-developed, well-nourished and in no acute distress Eyes:  anicteric. ENT:   Mouth and posterior pharynx free of lesions.  Neck:   supple w/o thyromegaly or mass.  Lungs: Clear to auscultation bilaterally. Heart:  S1S2, no rubs, murmurs, gallops. Abdomen:  soft, non-tender, no hepatosplenomegaly, hernia, or mass and BS+. - there is some tenderness just below Xiphoid but mild. No pain with abdominal wall tension. Lymph:  no cervical or supraclavicular adenopathy. Extremities:   no edema Skin   psoriatic plaques moderately severe on elbows, knees, shins Neuro:  A&O x 3.  Psych:  appropriate mood and  Affect.   Data Reviewed:   Chemistry      Component Value Date/Time   NA 136 01/18/2012 1543   K 3.5 01/18/2012 1543   CL 101 01/18/2012 1543   CO2 27  01/18/2012 1543   BUN 15 01/18/2012 1543   CREATININE 0.9 01/18/2012 1543      Component Value Date/Time   CALCIUM 9.4 01/18/2012 1543   ALKPHOS 57 01/18/2012 1543   AST 47* 01/18/2012 1543   ALT 62* 01/18/2012 1543   BILITOT 0.9 01/18/2012 1543     Hep B Surface Ag negative Hep B surface Ab negative HCV Ab negative     Assessment & Plan:   1. Abnormal transaminases  Ferritin, Hepatic function panel, CK (Creatine Kinase), ANA, US Abdomen Limited RUQ  2. Epigastric abdominal pain  Ferritin, Hepatic function panel ANA, US Abdomen Limited RUQ, lipase, amylase   Not sure there is a unifying diagnosis but alcoholic gastritis and liver disease could be. Pain and tenderness not classic for anything. Some recent increases could be from spine surgery.  1. [place] RUQ 2. Repeat LFT's and labs as above 3. Reduce EtOH to not more than 2/day 4. Nexium 40 mg daily x 1 month - samples provided 5. F/U will be arranged 6. He has not had Hepatitis B based upon serology - removed from [facility]  I appreciate the opportunity to care for this patient.  [id number] Freida Busman, MD

## 2012-03-11 NOTE — Patient Instructions (Addendum)
Your physician has requested that you go to the basement for lab work before leaving today.  Reduce your alcohol intake.  Today we are giving you samples of Nexium to try:  Take one capsule every day 30 minutes before breakfast.   You have been scheduled for an abdominal ultrasound at [facility] (1st floor of hospital) on 03/15/12 at 3pm. Please arrive 15 minutes prior to your appointment for registration. Make certain not to have anything to eat or drink 6 hours prior to your appointment. Should you need to reschedule your appointment, please contact radiology at [phone]. This test typically takes about 30 minutes to perform.  Thank you for choosing me and [facility].  Iva Boop, M.D., [facility]

## 2012-03-14 LAB — ANA: Anti Nuclear Antibody(ANA): NEGATIVE

## 2012-03-15 ENCOUNTER — Ambulatory Visit (HOSPITAL_COMMUNITY)
Admission: RE | Admit: 2012-03-15 | Discharge: 2012-03-15 | Disposition: A | Discharge status: Home / Self Care | Payer: 59 | Source: Ambulatory Visit | Attending: Internal Medicine | Admitting: Internal Medicine

## 2012-03-15 DIAGNOSIS — R748 Abnormal levels of other serum enzymes: Secondary | ICD-10-CM

## 2012-03-15 DIAGNOSIS — R1013 Epigastric pain: Secondary | ICD-10-CM

## 2012-03-17 ENCOUNTER — Ambulatory Visit (HOSPITAL_COMMUNITY): Admission: RE | Admit: 2012-03-17 | Payer: 59 | Source: Ambulatory Visit

## 2012-03-18 ENCOUNTER — Ambulatory Visit (HOSPITAL_COMMUNITY)
Admission: RE | Admit: 2012-03-18 | Discharge: 2012-03-18 | Disposition: A | Discharge status: Home / Self Care | Payer: 59 | Source: Ambulatory Visit | Attending: Internal Medicine | Admitting: Internal Medicine

## 2012-03-18 ENCOUNTER — Other Ambulatory Visit: Payer: Self-pay | Admitting: Internal Medicine

## 2012-03-18 DIAGNOSIS — R748 Abnormal levels of other serum enzymes: Secondary | ICD-10-CM

## 2012-03-18 DIAGNOSIS — R1013 Epigastric pain: Secondary | ICD-10-CM

## 2012-03-18 DIAGNOSIS — R7401 Elevation of levels of liver transaminase levels: Secondary | ICD-10-CM | POA: Insufficient documentation

## 2012-03-18 DIAGNOSIS — R7402 Elevation of levels of lactic acid dehydrogenase (LDH): Secondary | ICD-10-CM | POA: Insufficient documentation

## 2012-03-21 ENCOUNTER — Other Ambulatory Visit: Payer: Self-pay

## 2012-03-21 DIAGNOSIS — R7401 Elevation of levels of liver transaminase levels: Secondary | ICD-10-CM

## 2012-03-21 DIAGNOSIS — R7402 Elevation of levels of lactic acid dehydrogenase (LDH): Secondary | ICD-10-CM

## 2012-03-21 NOTE — Progress Notes (Signed)
Quick Note:  [place] looks ok  I want him to check LFT's 1 month after reducing alcohol to 2 or less drinks/day ______

## 2012-03-26 ENCOUNTER — Other Ambulatory Visit: Payer: Self-pay

## 2012-12-15 ENCOUNTER — Other Ambulatory Visit: Payer: Self-pay

## 2013-01-29 ENCOUNTER — Other Ambulatory Visit: Payer: Self-pay | Admitting: Family Medicine

## 2013-02-10 ENCOUNTER — Other Ambulatory Visit (INDEPENDENT_AMBULATORY_CARE_PROVIDER_SITE_OTHER): Payer: 59

## 2013-02-10 DIAGNOSIS — Z0000 Encounter for general adult medical examination without abnormal findings: Secondary | ICD-10-CM

## 2013-02-10 LAB — BASIC METABOLIC PANEL
BUN: 16 mg/dL (ref 6–23)
CO2: 24 mEq/L (ref 19–32)
Calcium: 8.8 mg/dL (ref 8.4–10.5)
Chloride: 107 mEq/L (ref 96–112)
Creatinine, Ser: 0.9 mg/dL (ref 0.4–1.5)
GFR: 100.65 mL/min (ref >60.00)
Glucose, Bld: 101 mg/dL — ABNORMAL HIGH (ref 70–99)
Potassium: 3.9 mEq/L (ref 3.5–5.1)
Sodium: 139 mEq/L (ref 135–145)

## 2013-02-10 LAB — POCT URINALYSIS DIPSTICK
Bilirubin, UA: NEGATIVE
Blood, UA: NEGATIVE
Glucose, UA: NEGATIVE
Ketones, UA: NEGATIVE
Leukocytes, UA: NEGATIVE
Nitrite, UA: NEGATIVE
Protein, UA: NEGATIVE
Spec Grav, UA: 1.02
Urobilinogen, UA: 0.2
pH, UA: 5.5

## 2013-02-10 LAB — CBC WITH DIFFERENTIAL/PLATELET
Basophils Absolute: 0 10*3/uL (ref 0.0–0.1)
Basophils Relative: 0.3 % (ref 0.0–3.0)
Eosinophils Absolute: 0.1 10*3/uL (ref 0.0–0.7)
Eosinophils Relative: 1.5 % (ref 0.0–5.0)
HCT: 43.2 % (ref 39.0–52.0)
Hemoglobin: 14.6 g/dL (ref 13.0–17.0)
Lymphocytes Relative: 24.9 % (ref 12.0–46.0)
Lymphs Abs: 1.8 10*3/uL (ref 0.7–4.0)
MCHC: 33.8 g/dL (ref 30.0–36.0)
MCV: 90 fl (ref 78.0–100.0)
Monocytes Absolute: 0.8 10*3/uL (ref 0.1–1.0)
Monocytes Relative: 10.7 % (ref 3.0–12.0)
Neutro Abs: 4.6 10*3/uL (ref 1.4–7.7)
Neutrophils Relative %: 62.6 % (ref 43.0–77.0)
Platelets: 239 10*3/uL (ref 150.0–400.0)
RBC: 4.79 Mil/uL (ref 4.22–5.81)
RDW: 13.1 % (ref 11.5–14.6)
WBC: 7.4 10*3/uL (ref 4.5–10.5)

## 2013-02-10 LAB — HEPATIC FUNCTION PANEL
ALT: 33 U/L (ref 0–53)
AST: 38 U/L — ABNORMAL HIGH (ref 0–37)
Albumin: 4.1 g/dL (ref 3.5–5.2)
Alkaline Phosphatase: 47 U/L (ref 39–117)
Bilirubin, Direct: 0 mg/dL (ref 0.0–0.3)
Total Bilirubin: 0.6 mg/dL (ref 0.3–1.2)
Total Protein: 7.2 g/dL (ref 6.0–8.3)

## 2013-02-10 LAB — LIPID PANEL
Cholesterol: 242 mg/dL — ABNORMAL HIGH (ref 0–200)
HDL: 69.2 mg/dL (ref >39.00)
Total CHOL/HDL Ratio: 3
Triglycerides: 205 mg/dL — ABNORMAL HIGH (ref 0.0–149.0)
VLDL: 41 mg/dL — ABNORMAL HIGH (ref 0.0–40.0)

## 2013-02-10 LAB — LDL CHOLESTEROL, DIRECT: Direct LDL: 136 mg/dL

## 2013-02-10 LAB — TSH: TSH: 1.12 u[IU]/mL (ref 0.35–5.50)

## 2013-02-14 ENCOUNTER — Other Ambulatory Visit: Payer: 59

## 2013-02-21 ENCOUNTER — Encounter: Payer: Self-pay | Admitting: Family Medicine

## 2013-02-21 ENCOUNTER — Ambulatory Visit (INDEPENDENT_AMBULATORY_CARE_PROVIDER_SITE_OTHER): Payer: 59 | Admitting: Family Medicine

## 2013-02-21 VITALS — BP 120/80 | Temp 98.4°F | Ht 69.75 in | Wt 179.0 lb

## 2013-02-21 DIAGNOSIS — Z0000 Encounter for general adult medical examination without abnormal findings: Secondary | ICD-10-CM

## 2013-02-21 DIAGNOSIS — L409 Psoriasis, unspecified: Secondary | ICD-10-CM

## 2013-02-21 DIAGNOSIS — E785 Hyperlipidemia, unspecified: Secondary | ICD-10-CM

## 2013-02-21 DIAGNOSIS — L408 Other psoriasis: Secondary | ICD-10-CM

## 2013-02-21 MED ORDER — SIMVASTATIN 20 MG PO TABS: 20.0000 mg | ORAL_TABLET | Freq: Every evening | ORAL | Status: DC

## 2013-02-21 MED ORDER — TRIAMCINOLONE ACETONIDE 0.025 % EX OINT: 1.0000 "application " | TOPICAL_OINTMENT | Freq: Two times a day (BID) | CUTANEOUS | Status: DC

## 2013-02-21 MED ORDER — CALCIPOTRIENE 0.005 % EX CREA: TOPICAL_CREAM | Freq: Two times a day (BID) | CUTANEOUS | Status: DC

## 2013-02-21 NOTE — Progress Notes (Signed)
Pre visit review using our clinic review tool, if applicable. No additional management support is needed unless otherwise documented below in the visit note. 

## 2013-02-21 NOTE — Patient Instructions (Signed)
Apply small amounts of the triamcinolone gel and the Dovonex cream nightly  Zocor 20 mg...Marland KitchenMarland KitchenMarland Kitchen 1 daily  Return in one year sooner if any problem

## 2013-02-21 NOTE — Progress Notes (Signed)
   Subjective:    Patient ID: Timothy Hardin, male    DOB: 12/06/1965, 48 y.o.   MRN: [id number]  HPI In the is a 48 year old male divorce nonsmoker who comes in today for general physical examination because of a history of hyperlipidemia  He takes simvastatin 20 mg daily total cholesterol 242 triglycerides 205 HDL 69 LDL 136  He does not get routine eye care,,,,,, does get regular dental care,,,,,, colonoscopy at age 62  Family history unknown he was adopted   Review of Systems  Constitutional: Negative.   HENT: Negative.   Eyes: Negative.   Respiratory: Negative.   Cardiovascular: Negative.   Gastrointestinal: Negative.   Genitourinary: Negative.   Musculoskeletal: Negative.   Skin: Negative.   Neurological: Negative.   Psychiatric/Behavioral: Negative.        Objective:   Physical Exam  Nursing note and vitals reviewed. Constitutional: He is oriented to person, place, and time. He appears well-developed and well-nourished.  HENT:  Head: Normocephalic and atraumatic.  Right Ear: External ear normal.  Left Ear: External ear normal.  Nose: Nose normal.  Mouth/Throat: Oropharynx is clear and moist.  Eyes: Conjunctivae and EOM are normal. Pupils are equal, round, and reactive to light.  Neck: Normal range of motion. Neck supple. No JVD present. No tracheal deviation present. No thyromegaly present.  Cardiovascular: Normal rate, regular rhythm, normal heart sounds and intact distal pulses.  Exam reveals no gallop and no friction rub.   No murmur heard. No carotid or aortic bruits peripheral pulses 2+ and symmetrical  Pulmonary/Chest: Effort normal and breath sounds normal. No stridor. No respiratory distress. He has no wheezes. He has no rales. He exhibits no tenderness.  Abdominal: Soft. Bowel sounds are normal. He exhibits no distension and no mass. There is no tenderness. There is no rebound and no guarding.  Genitourinary: Penis normal. Guaiac negative stool. No penile  tenderness.  Slight bulge left groin very small hernia  Musculoskeletal: Normal range of motion. He exhibits no edema and no tenderness.  Lymphadenopathy:    He has no cervical adenopathy.  Neurological: He is alert and oriented to person, place, and time. He has normal reflexes. No cranial nerve deficit. He exhibits normal muscle tone.  Skin: Skin is warm and dry. No rash noted. No erythema. No pallor.  Total body skin exam normal except for scar left neck from previous cervical disc surgery and psoriasis lower extremities  Psychiatric: He has a normal mood and affect. His behavior is normal. Judgment and thought content normal.          Assessment & Plan:  Healthy male  Hyperlipidemia continue simvastatin 20 mg daily  Psoriasis triamcinolone gel along with Dovonex

## 2014-03-09 ENCOUNTER — Other Ambulatory Visit: Payer: Self-pay | Admitting: Family Medicine

## 2014-03-27 ENCOUNTER — Telehealth: Payer: Self-pay | Admitting: Family Medicine

## 2014-03-27 DIAGNOSIS — K4090 Unilateral inguinal hernia, without obstruction or gangrene, not specified as recurrent: Secondary | ICD-10-CM

## 2014-03-27 NOTE — Telephone Encounter (Signed)
Pt needs referral for hernia. Pt states dr todd told him to live w/ it as long as he could, but pt states he cannot any longer.  Pt would like to have referral ASAP. pls advise Pt aware dr todd out this week, but pt states he will wait for him to get message since he is familiar w/ his issue.

## 2014-03-27 NOTE — Telephone Encounter (Signed)
Please ask the patient where his hernia is and I will place a referral.

## 2014-03-28 NOTE — Telephone Encounter (Signed)
Pt states its on his left side in the groin area.

## 2014-03-28 NOTE — Telephone Encounter (Signed)
Referral placed.

## 2014-04-09 ENCOUNTER — Ambulatory Visit (INDEPENDENT_AMBULATORY_CARE_PROVIDER_SITE_OTHER): Payer: Self-pay | Admitting: Surgery

## 2014-04-09 NOTE — H&P (Signed)
History of Present Illness Timothy Hardin. Timothy Amadi MD; 04/09/2014 10:49 AM) Patient words: hernia.  The patient is a 49 year old male who presents with an inguinal hernia. Referred by Dr. Christie Nottingham for left inguinal hernia. The patient is a 49 year old male who presents with a two-year history of a small bulge in his left groin. This has begun to cause some discomfort. He also has some pain in his right groin. He denies any obstructive symptoms. 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 He also has to do a lot of heavy lifting. He works out regularly. He presents now for evaluation of hernias. Other Problems (Timothy Eversole, LPN; 4/43/1540 0:86 AM) Hypercholesterolemia Inguinal Hernia  Past Surgical History (Timothy Eversole, LPN; 7/61/9509 3:26 AM) Laparoscopic Inguinal Hernia Surgery Bilateral. Spinal Surgery - Neck  Diagnostic Studies History (Timothy Eversole, LPN; 08/21/4578 9:98 AM) Colonoscopy never  Allergies (Timothy Eversole, LPN; 3/38/2505 3:97 AM) Meperidine HCl *ANALGESICS - OPIOID* Hydrocodone-Acetaminophen *ANALGESICS - OPIOID*  Medication History (Timothy Eversole, LPN; 6/73/4193 7:90 AM) Simvastatin (10MG  Tablet, Oral) Active.  Social History (Timothy Eversole, LPN; 2/40/9735 3:29 AM) Alcohol use Occasional alcohol use. No caffeine use No drug use Tobacco use Never smoker.  Family History (Timothy Eversole, LPN; 11/02/2681 4:19 AM) Family history unknown First Degree Relatives     Review of Systems (Timothy Eversole LPN; 07/31/2977 8:92 AM) General Not Present- Appetite Loss, Chills, Fatigue, Fever, Night Sweats, Weight Gain and Weight Loss. Skin Not Present- Change in Wart/Mole, Dryness, Hives, Jaundice, New Lesions, Non-Healing Wounds, Rash and Ulcer. HEENT Not Present- Earache, Hearing Loss, Hoarseness, Nose Bleed, Oral Ulcers, Ringing in the Ears, Seasonal Allergies, Sinus Pain, Sore Throat, Visual Disturbances, Wears glasses/contact lenses and Yellow  Eyes. Respiratory Not Present- Bloody sputum, Chronic Cough, Difficulty Breathing, Snoring and Wheezing. Breast Not Present- Breast Mass, Breast Pain, Nipple Discharge and Skin Changes. Cardiovascular Not Present- Chest Pain, Difficulty Breathing Lying Down, Leg Cramps, Palpitations, Rapid Heart Rate, Shortness of Breath and Swelling of Extremities. Male Genitourinary Not Present- Blood in Urine, Change in Urinary Stream, Frequency, Impotence, Nocturia, Painful Urination, Urgency and Urine Leakage. Musculoskeletal Not Present- Back Pain, Joint Pain, Joint Stiffness, Muscle Pain, Muscle Weakness and Swelling of Extremities. Neurological Not Present- Decreased Memory, Fainting, Headaches, Numbness, Seizures, Tingling, Tremor, Trouble walking and Weakness. Endocrine Not Present- Cold Intolerance, Excessive Hunger, Hair Changes, Heat Intolerance, Hot flashes and New Diabetes.  Vitals (Timothy Eversole LPN; 02/28/4172 0:81 AM) 04/09/2014 9:44 AM Weight: 173.8 lb Height: 68in Body Surface Area: 1.94 m Body Mass Index: 26.43 kg/m Pulse: 82 (Regular)  BP: 110/78 (Sitting, Left Arm, Standard)     Physical Exam Timothy Key K. Daiel Strohecker MD; 04/09/2014 10:49 AM)  The physical exam findings are as follows: Note:WDWN in NAD HEENT: EOMI, sclera anicteric Neck: No masses, no thyromegaly Lungs: CTA bilaterally; normal respiratory effort CV: Regular rate and rhythm; no murmurs Abd: +bowel sounds, soft, non-tender, no masses GU: bilateral descended testes; no testicular masses; small bilateral inguinal hernias - spontaneously reducible Ext: Well-perfused; no edema Skin: Warm, dry; no sign of jaundice    Assessment & Plan Timothy Key K. Felisha Claytor MD; 04/09/2014 10:24 AM)  BILATERAL INGUINAL HERNIA WITHOUT OBSTRUCTION OR GANGRENE, RECURRENCE NOT SPECIFIED (550.92  K40.20)  Current Plans Schedule for Surgery - Laparoscopic bilateral inguinal hernia repairs with mesh. The surgical procedure has been  discussed with the patient. Potential risks, benefits, alternative treatments, and expected outcomes have been explained. All of the patient's questions at this time have been answered. The  likelihood of reaching the patient's treatment goal is good. The patient understand the proposed surgical procedure and wishes to proceed.   Timothy Hardin. Georgette Dover, MD, [facility]  General/ Trauma Surgery  04/09/2014 10:50 AM

## 2014-06-04 ENCOUNTER — Ambulatory Visit (INDEPENDENT_AMBULATORY_CARE_PROVIDER_SITE_OTHER): Payer: 59 | Admitting: Family Medicine

## 2014-06-04 VITALS — BP 130/90 | Temp 98.4°F | Wt 169.5 lb

## 2014-06-04 DIAGNOSIS — W3400XD Accidental discharge from unspecified firearms or gun, subsequent encounter: Secondary | ICD-10-CM

## 2014-06-04 DIAGNOSIS — S81831A Puncture wound without foreign body, right lower leg, initial encounter: Secondary | ICD-10-CM | POA: Insufficient documentation

## 2014-06-04 DIAGNOSIS — T148 Other injury of unspecified body region: Secondary | ICD-10-CM

## 2014-06-04 DIAGNOSIS — Y249XXA Unspecified firearm discharge, undetermined intent, initial encounter: Secondary | ICD-10-CM | POA: Insufficient documentation

## 2014-06-04 DIAGNOSIS — W3400XA Accidental discharge from unspecified firearms or gun, initial encounter: Secondary | ICD-10-CM | POA: Diagnosis not present

## 2014-06-04 DIAGNOSIS — S81801D Unspecified open wound, right lower leg, subsequent encounter: Secondary | ICD-10-CM

## 2014-06-04 DIAGNOSIS — S81831D Puncture wound without foreign body, right lower leg, subsequent encounter: Secondary | ICD-10-CM

## 2014-06-04 MED ORDER — CEFTRIAXONE SODIUM 1 G IJ SOLR
1.0000 g | INTRAMUSCULAR | Status: AC
  Administered 2014-06-04: 1 g via INTRAMUSCULAR

## 2014-06-04 NOTE — Progress Notes (Signed)
Pre visit review using our clinic review tool, if applicable. No additional management support is needed unless otherwise documented below in the visit note. 
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
## 2014-06-04 NOTE — Patient Instructions (Signed)
Go to the general surgery office at 2:00 today. I've made an appointment for you to see Dr. Redmond Pulling  We did a culture of the wound  We gave you 1 g of Rocephin IM.........Marland Kitchen Dr. Redmond Pulling will determine what type of oral antibiotics you might need going forward

## 2014-06-07 LAB — WOUND CULTURE
Gram Stain: NONE SEEN
Gram Stain: NONE SEEN
Organism ID, Bacteria: NO GROWTH

## 2014-06-20 ENCOUNTER — Telehealth (HOSPITAL_COMMUNITY): Payer: Self-pay

## 2014-06-20 NOTE — Telephone Encounter (Signed)
Gave appt for today at 2:30

## 2014-09-17 ENCOUNTER — Telehealth: Payer: Self-pay | Admitting: Family Medicine

## 2014-09-17 NOTE — Telephone Encounter (Signed)
Pt would like a cpe this month, due to turning 49 and needing colonoscopy. Is it ok to work him in? No cpes left

## 2014-09-17 NOTE — Telephone Encounter (Signed)
Mr. Timothy Hardin needs to have an established visit prior to CPE

## 2014-09-18 NOTE — Telephone Encounter (Signed)
Lm on vm to cb °

## 2014-09-19 NOTE — Telephone Encounter (Signed)
Pt would like to wait on dr todd . Pt would like to know if dr todd will work him in for a cpx

## 2014-09-21 NOTE — Telephone Encounter (Signed)
Per Dr Sherren Mocha, he is not working patients in. thanks

## 2014-11-15 ENCOUNTER — Telehealth: Payer: Self-pay | Admitting: Family Medicine

## 2014-11-15 DIAGNOSIS — E785 Hyperlipidemia, unspecified: Secondary | ICD-10-CM

## 2014-11-15 NOTE — Telephone Encounter (Signed)
Order placed.  Please schedule a lab appointment

## 2014-11-15 NOTE — Telephone Encounter (Signed)
Pt stopped taking his chole med about 5 monts ago. Pt would like to have chole recheck

## 2014-11-16 NOTE — Telephone Encounter (Signed)
Left message of machine for patient to call back

## 2014-11-22 NOTE — Telephone Encounter (Signed)
Left message on pt answering machine for pt to callback

## 2014-11-26 NOTE — Telephone Encounter (Signed)
Pt also would like liver check also

## 2014-11-26 NOTE — Telephone Encounter (Signed)
Left message on pt answering machine for pt to callback

## 2014-11-27 NOTE — Telephone Encounter (Signed)
Orders placed and linked to appointment.

## 2014-11-28 ENCOUNTER — Other Ambulatory Visit (INDEPENDENT_AMBULATORY_CARE_PROVIDER_SITE_OTHER): Payer: 59

## 2014-11-28 DIAGNOSIS — E785 Hyperlipidemia, unspecified: Secondary | ICD-10-CM

## 2014-11-28 LAB — LIPID PANEL
Cholesterol: 258 mg/dL — ABNORMAL HIGH (ref 0–200)
HDL: 80.3 mg/dL (ref >39.00)
LDL Cholesterol: 154 mg/dL — ABNORMAL HIGH (ref 0–99)
NonHDL: 177.24
Total CHOL/HDL Ratio: 3
Triglycerides: 114 mg/dL (ref 0.0–149.0)
VLDL: 22.8 mg/dL (ref 0.0–40.0)

## 2014-11-28 LAB — HEPATIC FUNCTION PANEL
ALT: 23 U/L (ref 0–53)
AST: 24 U/L (ref 0–37)
Albumin: 4.3 g/dL (ref 3.5–5.2)
Alkaline Phosphatase: 39 U/L (ref 39–117)
Bilirubin, Direct: 0.1 mg/dL (ref 0.0–0.3)
Total Bilirubin: 0.7 mg/dL (ref 0.2–1.2)
Total Protein: 7.7 g/dL (ref 6.0–8.3)

## 2014-12-03 ENCOUNTER — Telehealth: Payer: Self-pay | Admitting: Family Medicine

## 2014-12-03 NOTE — Telephone Encounter (Signed)
Pt needs blood work results °

## 2014-12-04 NOTE — Telephone Encounter (Signed)
Left message on machine for patient to return our call.  What labs and why?

## 2014-12-05 ENCOUNTER — Telehealth: Payer: Self-pay | Admitting: Family Medicine

## 2014-12-05 NOTE — Telephone Encounter (Signed)
Patient would like lab results and it was released to mychart.

## 2014-12-05 NOTE — Telephone Encounter (Signed)
relased to mychart. Normal results

## 2014-12-05 NOTE — Telephone Encounter (Signed)
Pt would like a call back about lab results he had last week  °

## 2014-12-05 NOTE — Telephone Encounter (Signed)
Attempted to call patient but voice mail has not been set up yet.

## 2014-12-07 ENCOUNTER — Telehealth: Payer: Self-pay | Admitting: *Deleted

## 2014-12-07 NOTE — Telephone Encounter (Signed)
Patient would like to know if he needs to restart his cholesterol medication?

## 2014-12-12 NOTE — Telephone Encounter (Signed)
Per Dr Sherren Mocha patient does not have to start his cholesterol medication, but should come in for his yearly physical. Left message on machine for patient.

## 2014-12-25 ENCOUNTER — Encounter: Payer: Self-pay | Admitting: Internal Medicine

## 2015-02-14 ENCOUNTER — Encounter: Payer: 59 | Admitting: Adult Health

## 2015-02-18 ENCOUNTER — Encounter: Payer: 59 | Admitting: Gastroenterology

## 2015-02-27 ENCOUNTER — Encounter: Payer: 59 | Admitting: Internal Medicine

## 2015-02-27 ENCOUNTER — Ambulatory Visit (AMBULATORY_SURGERY_CENTER): Payer: Self-pay

## 2015-02-27 VITALS — Ht 68.0 in | Wt 175.0 lb

## 2015-02-27 DIAGNOSIS — Z1211 Encounter for screening for malignant neoplasm of colon: Secondary | ICD-10-CM

## 2015-02-27 NOTE — Progress Notes (Signed)
No allergies to eggs or soy No past problems with anesthesia No diet/weight loss meds No home oxygen  Has email and internet but doesn't use; refused emmi

## 2015-03-04 ENCOUNTER — Encounter: Payer: 59 | Admitting: Internal Medicine

## 2015-03-12 ENCOUNTER — Ambulatory Visit (INDEPENDENT_AMBULATORY_CARE_PROVIDER_SITE_OTHER): Payer: 59 | Admitting: Adult Health

## 2015-03-12 ENCOUNTER — Encounter: Payer: Self-pay | Admitting: Adult Health

## 2015-03-12 VITALS — BP 122/90 | HR 80 | Temp 98.3°F | Wt 170.9 lb

## 2015-03-12 DIAGNOSIS — Z7689 Persons encountering health services in other specified circumstances: Secondary | ICD-10-CM

## 2015-03-12 DIAGNOSIS — Z7189 Other specified counseling: Secondary | ICD-10-CM

## 2015-03-12 DIAGNOSIS — Z789 Other specified health status: Secondary | ICD-10-CM

## 2015-03-12 DIAGNOSIS — L408 Other psoriasis: Secondary | ICD-10-CM

## 2015-03-12 DIAGNOSIS — Z7289 Other problems related to lifestyle: Secondary | ICD-10-CM

## 2015-03-12 NOTE — Patient Instructions (Signed)
It was great meeting you today.   Stop by the front desk and schedule your appointment for a complete physical.   Let me know if you need anything in the meantime.

## 2015-03-12 NOTE — Progress Notes (Signed)
Pre visit review using our clinic review tool, if applicable. No additional management support is needed unless otherwise documented below in the visit note. 
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
## 2015-03-13 ENCOUNTER — Ambulatory Visit (AMBULATORY_SURGERY_CENTER): Payer: 59 | Admitting: Internal Medicine

## 2015-03-13 ENCOUNTER — Encounter: Payer: Self-pay | Admitting: Internal Medicine

## 2015-03-13 VITALS — BP 119/77 | HR 61 | Temp 97.5°F | Resp 12 | Ht 68.0 in | Wt 175.0 lb

## 2015-03-13 DIAGNOSIS — Z1211 Encounter for screening for malignant neoplasm of colon: Secondary | ICD-10-CM | POA: Diagnosis not present

## 2015-03-13 DIAGNOSIS — D125 Benign neoplasm of sigmoid colon: Secondary | ICD-10-CM

## 2015-03-13 LAB — HM COLONOSCOPY

## 2015-03-13 MED ORDER — SODIUM CHLORIDE 0.9 % IV SOLN: 500.0000 mL | INTRAVENOUS | Status: DC

## 2015-03-13 NOTE — Op Note (Signed)
[place]  [address] [place], [address]   COLONOSCOPY PROCEDURE REPORT  PATIENT: Timothy Hardin, Timothy Hardin  MR#: [id number] BIRTHDATE: March 24, 1965 , 49  yrs. old GENDER: male ENDOSCOPIST: Gatha Mayer, MD, [facility] PROCEDURE DATE:  03/13/2015 PROCEDURE:   Colonoscopy, screening and Colonoscopy with snare polypectomy First Screening Colonoscopy - Avg.  risk and is 50 yrs.  old or older Yes.  Prior Negative Screening - Now for repeat screening. N/A  History of Adenoma - Now for follow-up colonoscopy & has been > or = to 3 yrs.  N/A  Polyps removed today? Yes ASA CLASS:   Class II INDICATIONS:Screening for colonic neoplasia and Colorectal Neoplasm Risk Assessment for this procedure is average risk. MEDICATIONS: Propofol 300 mg IV and Monitored anesthesia care  DESCRIPTION OF PROCEDURE:   After the risks benefits and alternatives of the procedure were thoroughly explained, informed consent was obtained.  The digital rectal exam revealed no abnormalities of the rectum, revealed no prostatic nodules, and revealed the prostate was not enlarged.   The LB [id number] endoscope was introduced through the anus and advanced to the cecum, which was identified by both the appendix and ileocecal valve. No adverse events experienced.   The quality of the prep was excellent.  (MiraLax was used)  The instrument was then slowly withdrawn as the colon was fully examined. Estimated blood loss is zero unless otherwise noted in this procedure report.      COLON FINDINGS: A polypoid shaped semi-pedunculated polyp measuring 8 mm in size was found in the distal sigmoid colon.  A polypectomy was performed using snare cautery.  The resection was complete, the polyp tissue was completely retrieved and sent to histology.   A normal appearing cecum, ileocecal valve, and appendiceal orifice were identified.  The ascending, transverse, descending, sigmoid colon, and rectum appeared  unremarkable.  Retroflexed views revealed no abnormalities. The time to cecum = 2.3 Withdrawal time = 8.2   The scope was withdrawn and the procedure completed. COMPLICATIONS: There were no immediate complications.  ENDOSCOPIC IMPRESSION: 1.   Semi-pedunculated polyp was found in the distal sigmoid colon; polypectomy was performed using snare cautery 2.   Normal colonoscopy  RECOMMENDATIONS: 1.  Hold Aspirin and all other NSAIDS for 2 weeks. 2.  Timing of repeat colonoscopy will be determined by pathology findings.  Likely 5 yrs.  eSigned:  Gatha Mayer, MD, [facility] 03/13/2015 2:46 PM   cc: Dr. Christie Nottingham and The Patient

## 2015-03-13 NOTE — Patient Instructions (Addendum)
I found and removed one polyp - looks benign. I will let you know pathology results and when to have another routine colonoscopy by mail.  I appreciate the opportunity to care for you. Gatha Mayer, MD, [facility]  Discharge instructions given. Handout on polyps. Hold aspirin and all other NSAIDS  For 2 weeks. Resume previous medications. YOU HAD AN ENDOSCOPIC PROCEDURE TODAY AT [facility]:   Refer to the procedure report that was given to you for any specific questions about what was found during the examination.  If the procedure report does not answer your questions, please call your gastroenterologist to clarify.  If you requested that your care partner not be given the details of your procedure findings, then the procedure report has been included in a sealed envelope for you to review at your convenience later.  YOU SHOULD EXPECT: Some feelings of bloating in the abdomen. Passage of more gas than usual.  Walking can help get rid of the air that was put into your GI tract during the procedure and reduce the bloating. If you had a lower endoscopy (such as a colonoscopy or flexible sigmoidoscopy) you may notice spotting of blood in your stool or on the toilet paper. If you underwent a bowel prep for your procedure, you may not have a normal bowel movement for a few days.  Please Note:  You might notice some irritation and congestion in your nose or some drainage.  This is from the oxygen used during your procedure.  There is no need for concern and it should clear up in a day or so.  SYMPTOMS TO REPORT IMMEDIATELY:   Following lower endoscopy (colonoscopy or flexible sigmoidoscopy):  Excessive amounts of blood in the stool  Significant tenderness or worsening of abdominal pains  Swelling of the abdomen that is new, acute  Fever of 100F or higher   For urgent or emergent issues, a gastroenterologist can be reached at any hour by calling [phone].   DIET:  Your first meal following the procedure should be a small meal and then it is ok to progress to your normal diet. Heavy or fried foods are harder to digest and may make you feel nauseous or bloated.  Likewise, meals heavy in dairy and vegetables can increase bloating.  Drink plenty of fluids but you should avoid alcoholic beverages for 24 hours.  ACTIVITY:  You should plan to take it easy for the rest of today and you should NOT DRIVE or use heavy machinery until tomorrow (because of the sedation medicines used during the test).    FOLLOW UP: Our staff will call the number listed on your records the next business day following your procedure to check on you and address any questions or concerns that you may have regarding the information given to you following your procedure. If we do not reach you, we will leave a message.  However, if you are feeling well and you are not experiencing any problems, there is no need to return our call.  We will assume that you have returned to your regular daily activities without incident.  If any biopsies were taken you will be contacted by phone or by letter within the next 1-3 weeks.  Please call us at [phone] if you have not heard about the biopsies in 3 weeks.    SIGNATURES/CONFIDENTIALITY: You and/or your care partner have signed paperwork which will be entered into your electronic medical record.  These signatures attest  to the fact that that the information above on your After Visit Summary has been reviewed and is understood.  Full responsibility of the confidentiality of this discharge information lies with you and/or your care-partner. 

## 2015-03-13 NOTE — Progress Notes (Signed)
Called to room to assist during endoscopic procedure.  Patient ID and intended procedure confirmed with present staff. Received instructions for my participation in the procedure from the performing physician.  

## 2015-03-13 NOTE — Progress Notes (Signed)
Report to PACU, RN, vss, BBS= Clear.  

## 2015-03-14 ENCOUNTER — Telehealth: Payer: Self-pay

## 2015-03-14 NOTE — Telephone Encounter (Signed)
Left message on answering machine. 

## 2015-03-18 ENCOUNTER — Encounter: Payer: Self-pay | Admitting: Internal Medicine

## 2015-03-18 DIAGNOSIS — Z86010 Personal history of colonic polyps: Secondary | ICD-10-CM

## 2015-03-18 DIAGNOSIS — Z860101 Personal history of adenomatous and serrated colon polyps: Secondary | ICD-10-CM

## 2015-03-18 HISTORY — DX: Personal history of adenomatous and serrated colon polyps: Z86.0101

## 2015-03-18 HISTORY — DX: Personal history of colonic polyps: Z86.010

## 2015-03-18 NOTE — Progress Notes (Signed)
Quick Note:  8 mm adenoma Recall colonoscopy 2022 ______

## 2015-05-10 ENCOUNTER — Other Ambulatory Visit: Payer: 59

## 2015-05-15 ENCOUNTER — Other Ambulatory Visit (INDEPENDENT_AMBULATORY_CARE_PROVIDER_SITE_OTHER): Payer: 59

## 2015-05-15 DIAGNOSIS — Z1159 Encounter for screening for other viral diseases: Secondary | ICD-10-CM

## 2015-05-15 DIAGNOSIS — Z0000 Encounter for general adult medical examination without abnormal findings: Secondary | ICD-10-CM

## 2015-05-15 DIAGNOSIS — R7989 Other specified abnormal findings of blood chemistry: Secondary | ICD-10-CM

## 2015-05-15 LAB — CBC WITH DIFFERENTIAL/PLATELET
Basophils Absolute: 0 10*3/uL (ref 0.0–0.1)
Basophils Relative: 0.4 % (ref 0.0–3.0)
Eosinophils Absolute: 0.1 10*3/uL (ref 0.0–0.7)
Eosinophils Relative: 1.4 % (ref 0.0–5.0)
HCT: 42.5 % (ref 39.0–52.0)
Hemoglobin: 14.8 g/dL (ref 13.0–17.0)
Lymphocytes Relative: 28.8 % (ref 12.0–46.0)
Lymphs Abs: 1.6 10*3/uL (ref 0.7–4.0)
MCHC: 34.8 g/dL (ref 30.0–36.0)
MCV: 87.2 fl (ref 78.0–100.0)
Monocytes Absolute: 0.6 10*3/uL (ref 0.1–1.0)
Monocytes Relative: 10.8 % (ref 3.0–12.0)
Neutro Abs: 3.2 10*3/uL (ref 1.4–7.7)
Neutrophils Relative %: 58.6 % (ref 43.0–77.0)
Platelets: 222 10*3/uL (ref 150.0–400.0)
RBC: 4.87 Mil/uL (ref 4.22–5.81)
RDW: 13.3 % (ref 11.5–15.5)
WBC: 5.5 10*3/uL (ref 4.0–10.5)

## 2015-05-15 LAB — HEPATIC FUNCTION PANEL
ALT: 20 U/L (ref 0–53)
AST: 25 U/L (ref 0–37)
Albumin: 4.2 g/dL (ref 3.5–5.2)
Alkaline Phosphatase: 36 U/L — ABNORMAL LOW (ref 39–117)
Bilirubin, Direct: 0.1 mg/dL (ref 0.0–0.3)
Total Bilirubin: 0.7 mg/dL (ref 0.2–1.2)
Total Protein: 7 g/dL (ref 6.0–8.3)

## 2015-05-15 LAB — LIPID PANEL
Cholesterol: 262 mg/dL — ABNORMAL HIGH (ref 0–200)
HDL: 74.3 mg/dL (ref >39.00)
NonHDL: 188.04
Total CHOL/HDL Ratio: 4
Triglycerides: 376 mg/dL — ABNORMAL HIGH (ref 0.0–149.0)
VLDL: 75.2 mg/dL — ABNORMAL HIGH (ref 0.0–40.0)

## 2015-05-15 LAB — TSH: TSH: 2.43 u[IU]/mL (ref 0.35–4.50)

## 2015-05-15 LAB — POC URINALSYSI DIPSTICK (AUTOMATED)
Bilirubin, UA: NEGATIVE
Glucose, UA: NEGATIVE
Ketones, UA: NEGATIVE
Leukocytes, UA: NEGATIVE
Nitrite, UA: NEGATIVE
Protein, UA: NEGATIVE
Spec Grav, UA: 1.02
Urobilinogen, UA: 0.2
pH, UA: 5.5

## 2015-05-15 LAB — BASIC METABOLIC PANEL
BUN: 15 mg/dL (ref 6–23)
CO2: 26 mEq/L (ref 19–32)
Calcium: 9.2 mg/dL (ref 8.4–10.5)
Chloride: 105 mEq/L (ref 96–112)
Creatinine, Ser: 0.88 mg/dL (ref 0.40–1.50)
GFR: 97.11 mL/min (ref >60.00)
Glucose, Bld: 86 mg/dL (ref 70–99)
Potassium: 4.1 mEq/L (ref 3.5–5.1)
Sodium: 140 mEq/L (ref 135–145)

## 2015-05-15 LAB — PSA: PSA: 0.94 ng/mL (ref 0.10–4.00)

## 2015-05-15 LAB — LDL CHOLESTEROL, DIRECT: Direct LDL: 123 mg/dL

## 2015-05-16 ENCOUNTER — Ambulatory Visit (INDEPENDENT_AMBULATORY_CARE_PROVIDER_SITE_OTHER): Payer: 59 | Admitting: Adult Health

## 2015-05-16 ENCOUNTER — Encounter: Payer: Self-pay | Admitting: Adult Health

## 2015-05-16 VITALS — BP 120/84 | HR 80 | Temp 98.7°F | Ht 68.5 in | Wt 171.0 lb

## 2015-05-16 DIAGNOSIS — E7800 Pure hypercholesterolemia, unspecified: Secondary | ICD-10-CM

## 2015-05-16 DIAGNOSIS — Z7289 Other problems related to lifestyle: Secondary | ICD-10-CM

## 2015-05-16 DIAGNOSIS — Z789 Other specified health status: Secondary | ICD-10-CM | POA: Diagnosis not present

## 2015-05-16 DIAGNOSIS — L408 Other psoriasis: Secondary | ICD-10-CM

## 2015-05-16 DIAGNOSIS — Z0000 Encounter for general adult medical examination without abnormal findings: Secondary | ICD-10-CM | POA: Diagnosis not present

## 2015-05-16 LAB — HEPATITIS C ANTIBODY: HCV Ab: NEGATIVE

## 2015-05-16 NOTE — Addendum Note (Signed)
Addended by: Apolinar Junes on: 05/16/2015 02:36 PM   Modules accepted: Miquel Dunn

## 2015-05-16 NOTE — Progress Notes (Signed)
Subjective:    Patient ID: Timothy Hardin, male    DOB: February 16, 1965, 50 y.o.   MRN: [id number]  HPI  Patient presents for yearly preventative medicine examination. He has a history of  has a past medical history of Arthritis; Anemia; Hyperlipidemia; Plaque psoriasis; Spinal stenosis in cervical region; Elevated liver function tests; Encounter for blood transfusion; and adenomatous polyp of colon (03/18/2015).  All immunizations and health maintenance protocols were reviewed with the patient and needed orders were placed.  Medication reconciliation,  past medical history, social history, problem list and allergies were reviewed in detail with the patient  Goals were established with regard to weight loss, exercise, and  diet in compliance with medications.   He reports that he has started using concentrated vitamin D and his plague psoriasis has cleared up tremendously.   He has had a colonoscopy done and reports that he had one polyp. He is having a repeat in 5 years.   Unfortunately, he continues to drink " about a case of beer a week".   Review of Systems  Constitutional: Negative.   HENT: Negative.   Eyes: Negative.   Respiratory: Negative.   Cardiovascular: Negative.   Gastrointestinal: Negative.   Endocrine: Negative.   Genitourinary: Negative.   Musculoskeletal: Negative.   Skin: Negative.   Allergic/Immunologic: Negative.   Neurological: Negative.   Hematological: Negative.   Psychiatric/Behavioral: Negative.   All other systems reviewed and are negative.  Past Medical History  Diagnosis Date  . Arthritis   . Anemia     dx 6 mos. old  . Hyperlipidemia   . Plaque psoriasis   . Spinal stenosis in cervical region   . Elevated liver function tests   . Encounter for blood transfusion     anemia as an infant  . Hx of adenomatous polyp of colon 03/18/2015    Social History   Social History  . Marital Status: Single    Spouse Name: N/A  . Number of Children: N/A    . Years of Education: N/A   Occupational History  . Not on file.   Social History Main Topics  . Smoking status: Never Smoker   . Smokeless tobacco: Never Used  . Alcohol Use: 25.2 oz/week    42 Cans of beer per week     Comment: 6 daily  . Drug Use: 7.00 per week    Special: Marijuana     Comment: daily  . Sexual Activity: Not on file   Other Topics Concern  . 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 Mandible surgery  25 years ago    reduction  . Tonsillectomy    . Anterior cervical decomp/discectomy fusion  11/23/2011    Procedure: ANTERIOR CERVICAL DECOMPRESSION/DISCECTOMY FUSION 2 LEVELS;  Surgeon: Ophelia Charter, MD;  Location: [facility];  Service: Neurosurgery;  Laterality: N/A;  Cervical four-five Cervical five-six anterior cervical decompression with fusion interbody prothesis plating and bonegraft    Family History  Problem Relation Age of Onset  . Adopted: Yes  . Colon cancer Neg Hx     Allergies  Allergen Reactions  . Meperidine Hcl     REACTION: hives  . Hydrocodone-Acetaminophen Nausea Only    Intolerance    Current  Outpatient Prescriptions on File Prior to Visit  Medication Sig Dispense Refill  . Cholecalciferol (VITAMIN D) 2000 units tablet Take 2,000 Units by mouth daily. Reported on 03/12/2015     No current facility-administered medications on file prior to visit.    BP 120/84 mmHg  Pulse 80  Temp(Src) 98.7 F (37.1 C) (Oral)  Ht 5' 8.5" (1.74 m)  Wt 171 lb (77.565 kg)  BMI 25.62 kg/m2      Objective:   Physical Exam  Constitutional: He is oriented to person, place, and time. He appears well-developed and well-nourished. No distress.  HENT:  Head: Normocephalic and atraumatic.  Right Ear: External ear normal.  Left Ear: External ear normal.  Nose: Nose normal.  Mouth/Throat: Oropharynx  is clear and moist. No oropharyngeal exudate.  Eyes: Conjunctivae and EOM are normal. Pupils are equal, round, and reactive to light. Right eye exhibits no discharge. Left eye exhibits no discharge.  Neck: Normal range of motion. Neck supple. No tracheal deviation present. No thyromegaly present.  Cardiovascular: Normal rate, regular rhythm, normal heart sounds and intact distal pulses.  Exam reveals no gallop and no friction rub.   No murmur heard. Pulmonary/Chest: Effort normal and breath sounds normal. No respiratory distress. He has no wheezes. He has no rales. He exhibits no tenderness.  Abdominal: Soft. Bowel sounds are normal. He exhibits no distension and no mass. There is no tenderness. There is no rebound and no guarding.  Genitourinary: Prostate normal, testes normal and penis normal. Rectal exam shows external hemorrhoid. Guaiac negative stool. Prostate is not enlarged and not tender. No penile tenderness.  Musculoskeletal: Normal range of motion. He exhibits no edema or tenderness.  Lymphadenopathy:    He has no cervical adenopathy.  Neurological: He is alert and oriented to person, place, and time. He has normal reflexes. He displays normal reflexes. No cranial nerve deficit. He exhibits normal muscle tone. Coordination normal.  Skin: Skin is warm and dry. No rash noted. He is not diaphoretic. No erythema. No pallor.  Scattered areas of psoriasis throughout body. Otherwise normal skin exam   Psychiatric: He has a normal mood and affect. His behavior is normal. Judgment and thought content normal.  Nursing note and vitals reviewed.     Assessment & Plan:  1. Routine general medical examination at a health care facility - Reviewed labs in detail.  - Needs to work on diet - Continue to exercise as much as possible.   2. PSORIASIS Much improved since the last time I saw him  3. Alcohol use ([place]) - He needs to cut back. I asked him to cut back to a 6 pack a week. He will try it.     4. Pure hypercholesterolemia - Does not need to be placed on statin at this time.  -  He knows that his cholesterol panel is high. This is likely due to drinking.  -2.5% chance of cardiac event  - Will continue to monitor.     Dorothyann Peng, NP

## 2015-05-16 NOTE — Progress Notes (Signed)
Pre visit review using our clinic review tool, if applicable. No additional management support is needed unless otherwise documented below in the visit note. 

## 2015-05-16 NOTE — Addendum Note (Signed)
Addended by: Apolinar Junes on: 05/16/2015 02:37 PM   Modules accepted: Miquel Dunn

## 2016-07-03 ENCOUNTER — Ambulatory Visit (INDEPENDENT_AMBULATORY_CARE_PROVIDER_SITE_OTHER): Payer: 59 | Admitting: Adult Health

## 2016-07-03 ENCOUNTER — Encounter: Payer: Self-pay | Admitting: Adult Health

## 2016-07-03 VITALS — BP 102/64 | Temp 98.6°F | Ht 68.5 in | Wt 170.9 lb

## 2016-07-03 DIAGNOSIS — E785 Hyperlipidemia, unspecified: Secondary | ICD-10-CM | POA: Diagnosis not present

## 2016-07-03 DIAGNOSIS — Z789 Other specified health status: Secondary | ICD-10-CM | POA: Diagnosis not present

## 2016-07-03 DIAGNOSIS — L408 Other psoriasis: Secondary | ICD-10-CM | POA: Diagnosis not present

## 2016-07-03 DIAGNOSIS — Z7289 Other problems related to lifestyle: Secondary | ICD-10-CM

## 2016-07-03 NOTE — Progress Notes (Signed)
Patient presents to clinic today to establish care. He is a pleasant 51 year old male who  has a past medical history of Anemia; Arthritis; Elevated liver function tests; Encounter for blood transfusion; adenomatous polyp of colon (03/18/2015); Hyperlipidemia; Plaque psoriasis; and Spinal stenosis in cervical region.   Acute Concerns:  Establish Care    Chronic Issues: Alcohol Use - drinks about 6 beer per day. He does not want to cut back.   Hyperlipidemia - does not take any medication currently. Has taken a statin in the past but stopped taking it for unknown reasons   Plaque Psoriasis.   - This is a chronic issue for the patient. He has tried steroid creams in the past and endorse they work moderately but he cannot afford them. He continues to use concentrated vitamin D which has been working well    Health Maintenance: Dental -- Does not do routine care  Vision -- Does not do routine care  Immunizations -- UTD  Colonoscopy -- 2017 -  Diet: He tries to eat healthy  Exercise: He works out on a regular basis    Past Medical History:  Diagnosis Date  . Anemia    dx 6 mos. old  . Arthritis   . Elevated liver function tests   . Encounter for blood transfusion    anemia as an infant  . Hx of adenomatous polyp of colon 03/18/2015  . Hyperlipidemia   . Plaque psoriasis   . Spinal stenosis in cervical region     Past Surgical History:  Procedure Laterality Date  . ANTERIOR CERVICAL DECOMP/DISCECTOMY FUSION  11/23/2011   Procedure: ANTERIOR CERVICAL DECOMPRESSION/DISCECTOMY FUSION 2 LEVELS;  Surgeon: Ophelia Charter, MD;  Location: [facility];  Service: Neurosurgery;  Laterality: N/A;  Cervical four-five Cervical five-six anterior cervical decompression with fusion interbody prothesis plating and bonegraft  . MANDIBLE SURGERY  25 years ago   reduction  . TONSILLECTOMY      Current Outpatient Prescriptions on File Prior to Visit  Medication Sig Dispense Refill  .  Cholecalciferol (VITAMIN D) 2000 units tablet Take 2,000 Units by mouth daily. Reported on 03/12/2015     No current facility-administered medications on file prior to visit.     Allergies  Allergen Reactions  . Meperidine Hcl     REACTION: hives  . Hydrocodone-Acetaminophen Nausea Only    Intolerance    Family History  Problem Relation Age of Onset  . Adopted: Yes  . Colon cancer Neg Hx     Social History   Social History  . Marital status: Single    Spouse name: N/A  . Number of children: N/A  . Years of education: N/A   Occupational History  . Not on file.   Social History Main Topics  . Smoking status: Never Smoker  . Smokeless tobacco: Never Used  . Alcohol use 25.2 oz/week    42 Cans of beer per week     Comment: 6 daily  . Drug use: Yes    Frequency: 7.0 times per week    Types: Marijuana     Comment: daily  . Sexual activity: Not on file   Other Topics Concern  . 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   HENT:  Negative.   Eyes: Negative.   Cardiovascular: Negative.   Genitourinary: Negative.   Musculoskeletal: Negative.   Skin: Negative.   Neurological: Negative.     BP 102/64 (BP Location: Left Arm, Patient Position: Sitting, Cuff Size: Normal)   Temp 98.6 F (37 C) (Oral)   Ht 5' 8.5" (1.74 m)   Wt 170 lb 14.4 oz (77.5 kg)   BMI 25.61 kg/m   Physical Exam  Constitutional: He is oriented to person, place, and time and well-developed, well-nourished, and in no distress. No distress.  HENT:  Head: Normocephalic and atraumatic.  Right Ear: External ear normal.  Left Ear: External ear normal.  Nose: Nose normal.  Mouth/Throat: Oropharynx is clear and moist. No oropharyngeal exudate.  Eyes: Conjunctivae and EOM are normal. Pupils are equal, round, and reactive to light. Right eye exhibits no discharge. Left  eye exhibits no discharge. No scleral icterus.  Neck: Normal range of motion. Neck supple. No thyromegaly present.  Cardiovascular: Normal rate, regular rhythm, normal heart sounds and intact distal pulses.  Exam reveals no gallop and no friction rub.   No murmur heard. Pulmonary/Chest: Effort normal and breath sounds normal. No respiratory distress. He has no wheezes. He has no rales. He exhibits no tenderness.  Musculoskeletal: Normal range of motion.  Lymphadenopathy:    He has no cervical adenopathy.  Neurological: He is alert and oriented to person, place, and time. Gait normal. GCS score is 15.  Skin: Skin is warm and dry. No rash noted. He is not diaphoretic. No erythema. No pallor.   psoriasis on bilateral arms, legs, thighs and sacral area.    Psychiatric: Mood, memory, affect and judgment normal.  Nursing note and vitals reviewed.    Assessment/Plan: 1. PSORIASIS - Vitamin D seems to be working.  Advertising account planner given if he would like to follow up with them    2. Hyperlipidemia, unspecified hyperlipidemia type - Unfortunately, he has eaten throughout the afternoon. Will check at his CPE  - Consider restarting statin   3. Alcohol use - Educated on the importance of cutting his alcohol consumption. He is not interested in this currently.

## 2016-07-03 NOTE — Patient Instructions (Signed)
It is great seeing you to day   Please follow up with me for your physical - do not eat anything after midnight the night before your physical

## 2016-07-17 ENCOUNTER — Ambulatory Visit (INDEPENDENT_AMBULATORY_CARE_PROVIDER_SITE_OTHER): Payer: 59 | Admitting: Adult Health

## 2016-07-17 ENCOUNTER — Encounter: Payer: Self-pay | Admitting: Adult Health

## 2016-07-17 VITALS — BP 110/80 | HR 71 | Temp 97.8°F | Ht 69.25 in | Wt 170.5 lb

## 2016-07-17 DIAGNOSIS — E785 Hyperlipidemia, unspecified: Secondary | ICD-10-CM | POA: Diagnosis not present

## 2016-07-17 DIAGNOSIS — L408 Other psoriasis: Secondary | ICD-10-CM | POA: Diagnosis not present

## 2016-07-17 DIAGNOSIS — Z0000 Encounter for general adult medical examination without abnormal findings: Secondary | ICD-10-CM | POA: Diagnosis not present

## 2016-07-17 LAB — CBC WITH DIFFERENTIAL/PLATELET
Basophils Absolute: 0 10*3/uL (ref 0.0–0.1)
Basophils Relative: 0.3 % (ref 0.0–3.0)
Eosinophils Absolute: 0.1 10*3/uL (ref 0.0–0.7)
Eosinophils Relative: 0.7 % (ref 0.0–5.0)
HCT: 43.4 % (ref 39.0–52.0)
Hemoglobin: 14.8 g/dL (ref 13.0–17.0)
Lymphocytes Relative: 19.9 % (ref 12.0–46.0)
Lymphs Abs: 1.6 10*3/uL (ref 0.7–4.0)
MCHC: 34.2 g/dL (ref 30.0–36.0)
MCV: 92.7 fl (ref 78.0–100.0)
Monocytes Absolute: 0.7 10*3/uL (ref 0.1–1.0)
Monocytes Relative: 8.3 % (ref 3.0–12.0)
Neutro Abs: 5.7 10*3/uL (ref 1.4–7.7)
Neutrophils Relative %: 70.8 % (ref 43.0–77.0)
Platelets: 218 10*3/uL (ref 150.0–400.0)
RBC: 4.68 Mil/uL (ref 4.22–5.81)
RDW: 13.4 % (ref 11.5–15.5)
WBC: 8.1 10*3/uL (ref 4.0–10.5)

## 2016-07-17 LAB — BASIC METABOLIC PANEL
BUN: 19 mg/dL (ref 6–23)
CO2: 28 mEq/L (ref 19–32)
Calcium: 9.5 mg/dL (ref 8.4–10.5)
Chloride: 105 mEq/L (ref 96–112)
Creatinine, Ser: 0.89 mg/dL (ref 0.40–1.50)
GFR: 95.41 mL/min (ref >60.00)
Glucose, Bld: 93 mg/dL (ref 70–99)
Potassium: 4.4 mEq/L (ref 3.5–5.1)
Sodium: 141 mEq/L (ref 135–145)

## 2016-07-17 LAB — LIPID PANEL
Cholesterol: 267 mg/dL — ABNORMAL HIGH (ref 0–200)
HDL: 76.3 mg/dL (ref >39.00)
NonHDL: 190.48
Total CHOL/HDL Ratio: 3
Triglycerides: 269 mg/dL — ABNORMAL HIGH (ref 0.0–149.0)
VLDL: 53.8 mg/dL — ABNORMAL HIGH (ref 0.0–40.0)

## 2016-07-17 LAB — HEPATIC FUNCTION PANEL
ALT: 21 U/L (ref 0–53)
AST: 23 U/L (ref 0–37)
Albumin: 4.4 g/dL (ref 3.5–5.2)
Alkaline Phosphatase: 38 U/L — ABNORMAL LOW (ref 39–117)
Bilirubin, Direct: 0.1 mg/dL (ref 0.0–0.3)
Total Bilirubin: 0.6 mg/dL (ref 0.2–1.2)
Total Protein: 7 g/dL (ref 6.0–8.3)

## 2016-07-17 LAB — LDL CHOLESTEROL, DIRECT: Direct LDL: 135 mg/dL

## 2016-07-17 LAB — HEMOGLOBIN A1C: Hgb A1c MFr Bld: 5.4 % (ref 4.6–6.5)

## 2016-07-17 LAB — TSH: TSH: 1.63 u[IU]/mL (ref 0.35–4.50)

## 2016-07-17 LAB — PSA: PSA: 0.91 ng/mL (ref 0.10–4.00)

## 2016-07-17 NOTE — Patient Instructions (Signed)
It was great seeing you today   Please follow up with me in one year or sooner if needed  I will release your labs to mychart when they are back   Work on cutting back on alcohol and working on a heart healthy diet   Health Maintenance, Male A healthy lifestyle and preventative care can promote health and wellness.  Maintain regular health, dental, and eye exams.  Eat a healthy diet. Foods like vegetables, fruits, whole grains, low-fat dairy products, and lean protein foods contain the nutrients you need and are low in calories. Decrease your intake of foods high in solid fats, added sugars, and salt. Get information about a proper diet from your health care provider, if necessary.  Regular physical exercise is one of the most important things you can do for your health. Most adults should get at least 150 minutes of moderate-intensity exercise (any activity that increases your heart rate and causes you to sweat) each week. In addition, most adults need muscle-strengthening exercises on 2 or more days a week.   Maintain a healthy weight. The body mass index (BMI) is a screening tool to identify possible weight problems. It provides an estimate of body fat based on height and weight. Your health care provider can find your BMI and can help you achieve or maintain a healthy weight. For males 20 years and older:  A BMI below 18.5 is considered underweight.  A BMI of 18.5 to 24.9 is normal.  A BMI of 25 to 29.9 is considered overweight.  A BMI of 30 and above is considered obese.  Maintain normal blood lipids and cholesterol by exercising and minimizing your intake of saturated fat. Eat a balanced diet with plenty of fruits and vegetables. Blood tests for lipids and cholesterol should begin at age 41 and be repeated every 5 years. If your lipid or cholesterol levels are high, you are over age 44, or you are at high risk for heart disease, you may need your cholesterol levels checked more  frequently.Ongoing high lipid and cholesterol levels should be treated with medicines if diet and exercise are not working.  If you smoke, find out from your health care provider how to quit. If you do not use tobacco, do not start.  Lung cancer screening is recommended for adults aged 84-80 years who are at high risk for developing lung cancer because of a history of smoking. A yearly low-dose CT scan of the lungs is recommended for people who have at least a 30-pack-year history of smoking and are current smokers or have quit within the past 15 years. A pack year of smoking is smoking an average of 1 pack of cigarettes a day for 1 year (for example, a 30-pack-year history of smoking could mean smoking 1 pack a day for 30 years or 2 packs a day for 15 years). Yearly screening should continue until the smoker has stopped smoking for at least 15 years. Yearly screening should be stopped for people who develop a health problem that would prevent them from having lung cancer treatment.  If you choose to drink alcohol, do not have more than 2 drinks per day. One drink is considered to be 12 oz (360 mL) of beer, 5 oz (150 mL) of wine, or 1.5 oz (45 mL) of liquor.  Avoid the use of street drugs. Do not share needles with anyone. Ask for help if you need support or instructions about stopping the use of drugs.  High blood  pressure causes heart disease and increases the risk of stroke. High blood pressure is more likely to develop in:  People who have blood pressure in the end of the normal range (100-139/85-89 mm Hg).  People who are overweight or obese.  People who are African American.  If you are 72-61 years of age, have your blood pressure checked every 3-5 years. If you are 92 years of age or older, have your blood pressure checked every year. You should have your blood pressure measured twice--once when you are at a hospital or clinic, and once when you are not at a hospital or clinic. Record the  average of the two measurements. To check your blood pressure when you are not at a hospital or clinic, you can use:  An automated blood pressure machine at a pharmacy.  A home blood pressure monitor.  If you are 59-54 years old, ask your health care provider if you should take aspirin to prevent heart disease.  Diabetes screening involves taking a blood sample to check your fasting blood sugar level. This should be done once every 3 years after age 19 if you are at a normal weight and without risk factors for diabetes. Testing should be considered at a younger age or be carried out more frequently if you are overweight and have at least 1 risk factor for diabetes.  Colorectal cancer can be detected and often prevented. Most routine colorectal cancer screening begins at the age of 38 and continues through age 54. However, your health care provider may recommend screening at an earlier age if you have risk factors for colon cancer. On a yearly basis, your health care provider may provide home test kits to check for hidden blood in the stool. A small camera at the end of a tube may be used to directly examine the colon (sigmoidoscopy or colonoscopy) to detect the earliest forms of colorectal cancer. Talk to your health care provider about this at age 62 when routine screening begins. A direct exam of the colon should be repeated every 5-10 years through age 4, unless early forms of precancerous polyps or small growths are found.  People who are at an increased risk for hepatitis B should be screened for this virus. You are considered at high risk for hepatitis B if:  You were born in a country where hepatitis B occurs often. Talk with your health care provider about which countries are considered high risk.  Your parents were born in a high-risk country and you have not received a shot to protect against hepatitis B (hepatitis B vaccine).  You have HIV or AIDS.  You use needles to inject street  drugs.  You live with, or have sex with, someone who has hepatitis B.  You are a man who has sex with other men (MSM).  You get hemodialysis treatment.  You take certain medicines for conditions like cancer, organ transplantation, and autoimmune conditions.  Hepatitis C blood testing is recommended for all people born from [birth year] through 1966 and any individual with known risk factors for hepatitis C.  Healthy men should no longer receive prostate-specific antigen (PSA) blood tests as part of routine cancer screening. Talk to your health care provider about prostate cancer screening.  Testicular cancer screening is not recommended for adolescents or adult males who have no symptoms. Screening includes self-exam, a health care provider exam, and other screening tests. Consult with your health care provider about any symptoms you have or any concerns you  have about testicular cancer.  Practice safe sex. Use condoms and avoid high-risk sexual practices to reduce the spread of sexually transmitted infections (STIs).  You should be screened for STIs, including gonorrhea and chlamydia if:  You are sexually active and are younger than 24 years.  You are older than 24 years, and your health care provider tells you that you are at risk for this type of infection.  Your sexual activity has changed since you were last screened, and you are at an increased risk for chlamydia or gonorrhea. Ask your health care provider if you are at risk.  If you are at risk of being infected with HIV, it is recommended that you take a prescription medicine daily to prevent HIV infection. This is called pre-exposure prophylaxis (PrEP). You are considered at risk if:  You are a man who has sex with other men (MSM).  You are a heterosexual man who is sexually active with multiple partners.  You take drugs by injection.  You are sexually active with a partner who has HIV.  Talk with your health care provider about  whether you are at high risk of being infected with HIV. If you choose to begin PrEP, you should first be tested for HIV. You should then be tested every 3 months for as long as you are taking PrEP.  Use sunscreen. Apply sunscreen liberally and repeatedly throughout the day. You should seek shade when your shadow is shorter than you. Protect yourself by wearing long sleeves, pants, a wide-brimmed hat, and sunglasses year round whenever you are outdoors.  Tell your health care provider of new moles or changes in moles, especially if there is a change in shape or color. Also, tell your health care provider if a mole is larger than the size of a pencil eraser.  A one-time screening for abdominal aortic aneurysm (AAA) and surgical repair of large AAAs by ultrasound is recommended for men aged 79-75 years who are current or former smokers.  Stay current with your vaccines (immunizations).   This information is not intended to replace advice given to you by your health care provider. Make sure you discuss any questions you have with your health care provider.   Document Released: 07/25/2007 Document Revised: 02/16/2014 Document Reviewed: 06/23/2010 Elsevier Interactive Patient Education [employer].

## 2016-07-17 NOTE — Progress Notes (Signed)
Subjective:    Patient ID: Timothy Hardin, male    DOB: [birth year], 51 y.o.   MRN: [id number]  HPI  Patient presents for yearly preventative medicine examination. He is a pleasant 51 year old male who  has a past medical history of Anemia; Arthritis; Elevated liver function tests; Encounter for blood transfusion; adenomatous polyp of colon (03/18/2015); Hyperlipidemia; Plaque psoriasis; and Spinal stenosis in cervical region.  All immunizations and health maintenance protocols were reviewed with the patient and needed orders were placed.  Appropriate screening laboratory values were ordered for the patient including screening of hyperlipidemia, renal function and hepatic function. If indicated by BPH, a PSA was ordered.  Medication reconciliation,  past medical history, social history, problem list and allergies were reviewed in detail with the patient  Goals were established with regard to weight loss, exercise, and  diet in compliance with medications. He is exercising about 3 days a week but his diet is " terrible" . He has cut back to about 6 drinks per week.   He is up to date on his colonoscopy, dental and vision care.   He denies any interval history   Review of Systems  Constitutional: Negative.   HENT: Negative.   Eyes: Negative.   Respiratory: Negative.   Cardiovascular: Negative.   Gastrointestinal: Negative.   Endocrine: Negative.   Genitourinary: Negative.   Musculoskeletal: Negative.   Skin: Negative.   Allergic/Immunologic: Negative.   Neurological: Negative.   Hematological: Negative.   Psychiatric/Behavioral: Negative.   All other systems reviewed and are negative.  Past Medical History:  Diagnosis Date  . Anemia    dx 6 mos. old  . Arthritis   . Elevated liver function tests   . Encounter for blood transfusion    anemia as an infant  . Hx of adenomatous polyp of colon 03/18/2015  . Hyperlipidemia   . Plaque psoriasis   . Spinal stenosis in cervical  region     Social History   Social History  . Marital status: Single    Spouse name: N/A  . Number of children: N/A  . Years of education: N/A   Occupational History  . Not on file.   Social History Main Topics  . Smoking status: Never Smoker  . Smokeless tobacco: Never Used  . Alcohol use 25.2 oz/week    42 Cans of beer per week     Comment: 6 daily  . Drug use: Yes    Frequency: 7.0 times per week    Types: Marijuana     Comment: daily  . Sexual activity: Not on file   Other Topics Concern  . Not on file   Social History Narrative   Divorced, 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                   Past Surgical History:  Procedure Laterality Date  . ANTERIOR CERVICAL DECOMP/DISCECTOMY FUSION  11/23/2011   Procedure: ANTERIOR CERVICAL DECOMPRESSION/DISCECTOMY FUSION 2 LEVELS;  Surgeon: Ophelia Charter, MD;  Location: [facility];  Service: Neurosurgery;  Laterality: N/A;  Cervical four-five Cervical five-six anterior cervical decompression with fusion interbody prothesis plating and bonegraft  . MANDIBLE SURGERY  25 years ago   reduction  . TONSILLECTOMY      Family History  Problem Relation Age of Onset  . Adopted: Yes  . Colon cancer Neg Hx     Allergies  Allergen Reactions  . Meperidine Hcl  REACTION: hives  . Hydrocodone-Acetaminophen Nausea Only    Intolerance    Current Outpatient Prescriptions on File Prior to Visit  Medication Sig Dispense Refill  . Cholecalciferol (VITAMIN D) 2000 units tablet Take 2,000 Units by mouth daily. Reported on 03/12/2015     No current facility-administered medications on file prior to visit.     BP 110/80 (BP Location: Left Arm, Patient Position: Sitting, Cuff Size: Normal)   Pulse 71   Temp 97.8 F (36.6 C) (Oral)   Ht 5' 9.25" (1.759 m)   Wt 170 lb 8 oz (77.3 kg)   SpO2 98%   BMI 25.00 kg/m       Objective:   Physical Exam  Constitutional: He is oriented to person, place,  and time. He appears well-developed and well-nourished. No distress.  HENT:  Head: Normocephalic and atraumatic.  Right Ear: External ear normal.  Left Ear: External ear normal.  Nose: Nose normal.  Mouth/Throat: Oropharynx is clear and moist. No oropharyngeal exudate.  Eyes: Conjunctivae and EOM are normal. Pupils are equal, round, and reactive to light. Right eye exhibits no discharge. Left eye exhibits no discharge. No scleral icterus.  Neck: Normal range of motion. Neck supple. No JVD present. No tracheal deviation present. No thyromegaly present.  Cardiovascular: Normal rate, regular rhythm, normal heart sounds and intact distal pulses.  Exam reveals no gallop and no friction rub.   No murmur heard. Pulmonary/Chest: Effort normal and breath sounds normal. No stridor. No respiratory distress. He has no wheezes. He has no rales. He exhibits no tenderness.  Abdominal: Soft. Bowel sounds are normal. He exhibits no distension and no mass. There is no tenderness. There is no rebound and no guarding.  Musculoskeletal: Normal range of motion. He exhibits no edema, tenderness or deformity.  Lymphadenopathy:    He has no cervical adenopathy.  Neurological: He is alert and oriented to person, place, and time. He has normal reflexes. He displays normal reflexes. No cranial nerve deficit. He exhibits normal muscle tone. Coordination normal.  Skin: Skin is warm and dry. No rash noted. He is not diaphoretic. No erythema. No pallor.   psoriasis on bilateral arms, legs, thighs and sacral area.     Psychiatric: He has a normal mood and affect. His behavior is normal. Judgment and thought content normal.  Nursing note and vitals reviewed.     Assessment & Plan:  1. Routine general medical examination at a health care facility - Follow up with me in one year he needs to work on diet and exercise - Basic metabolic panel - CBC with Differential/Platelet - Hepatic function panel - Lipid panel - TSH -  Hemoglobin A1c - PSA  2. Hyperlipidemia, unspecified hyperlipidemia type  - Basic metabolic panel - CBC with Differential/Platelet - Hepatic function panel - Lipid panel - TSH - Hemoglobin A1c - PSA  3. PSORIASIS -Continue with Vitamin D  - See dermatology   Dorothyann Peng, NP

## 2016-10-30 ENCOUNTER — Encounter: Payer: Self-pay | Admitting: Adult Health

## 2017-02-12 ENCOUNTER — Encounter: Payer: Self-pay | Admitting: Adult Health

## 2017-02-12 ENCOUNTER — Ambulatory Visit: Payer: 59 | Admitting: Adult Health

## 2017-02-12 VITALS — BP 108/68 | Temp 98.3°F | Ht 69.0 in | Wt 170.0 lb

## 2017-02-12 DIAGNOSIS — D171 Benign lipomatous neoplasm of skin and subcutaneous tissue of trunk: Secondary | ICD-10-CM | POA: Diagnosis not present

## 2017-02-12 DIAGNOSIS — M130 Polyarthritis, unspecified: Secondary | ICD-10-CM | POA: Diagnosis not present

## 2017-02-12 MED ORDER — MELOXICAM 7.5 MG PO TABS: 7.5000 mg | ORAL_TABLET | Freq: Every day | ORAL | 1 refills | Status: DC

## 2017-02-12 NOTE — Progress Notes (Signed)
Subjective:    Patient ID: Timothy Hardin, male    DOB: 1965-04-28, 52 y.o.   MRN: [id number]  HPI  52 year old male who  has a past medical history of Anemia, Arthritis, Elevated liver function tests, Encounter for blood transfusion, adenomatous polyp of colon (03/18/2015), Hyperlipidemia, Plaque psoriasis, and Spinal stenosis in cervical region.   He presents to the clinic today with two separate complaints.   1. He has had an "abscess" on his lower back for 10 years. He feels as though it is getting larger in size. Is not painful. Has not noticed any redness or warmth   2. His arthritis pain is getting worse. He has been taking Tylenol and Motrin without out relief. Pain is located in multiple joints but mostly those that are covered with psoriasis patches.    Review of Systems See HPI   Past Medical History:  Diagnosis Date  . Anemia    dx 6 mos. old  . Arthritis   . Elevated liver function tests   . Encounter for blood transfusion    anemia as an infant  . Hx of adenomatous polyp of colon 03/18/2015  . Hyperlipidemia   . Plaque psoriasis   . Spinal stenosis in cervical region     Social History   Socioeconomic History  . Marital status: Single    Spouse name: Not on file  . Number of children: Not on file  . Years of education: Not on file  . Highest education level: Not on file  Social Needs  . Financial resource strain: Not on file  . Food insecurity - worry: Not on file  . Food insecurity - inability: Not on file  . Transportation needs - medical: Not on file  . Transportation needs - non-medical: Not on file  Occupational History  . Not on file  Tobacco Use  . Smoking status: Never Smoker  . Smokeless tobacco: Never Used  Substance and Sexual Activity  . Alcohol use: Yes    Alcohol/week: 6.0 oz    Types: 10 Cans of beer per week    Comment: 6 daily  . Drug use: Yes    Frequency: 7.0 times per week    Types: Marijuana    Comment: daily  . Sexual  activity: Not on file  Other Topics Concern  . 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 ANTERIOR CERVICAL DECOMP/DISCECTOMY FUSION  11/23/2011   Procedure: ANTERIOR CERVICAL DECOMPRESSION/DISCECTOMY FUSION 2 LEVELS;  Surgeon: Ophelia Charter, MD;  Location: [facility];  Service: Neurosurgery;  Laterality: N/A;  Cervical four-five Cervical five-six anterior cervical decompression with fusion interbody prothesis plating and bonegraft  . MANDIBLE SURGERY  25 years ago   reduction  . TONSILLECTOMY      Family History  Adopted: Yes  Problem Relation Age of Onset  . Colon cancer Neg Hx     Allergies  Allergen Reactions  . Meperidine Hcl     REACTION: hives  . Hydrocodone-Acetaminophen Nausea Only    Intolerance    Current Outpatient Medications on File Prior to Visit  Medication Sig Dispense Refill  . Cholecalciferol (VITAMIN D) 2000 units tablet Take 2,000 Units by mouth daily. Reported on 03/12/2015     No current facility-administered medications on  file prior to visit.     BP 108/68 (BP Location: Left Arm, Patient Position: Sitting, Cuff Size: Large)   Temp 98.3 F (36.8 C) (Oral)   Ht 5\' 9"  (1.753 m)   Wt 170 lb (77.1 kg)   BMI 25.10 kg/m       Objective:   Physical Exam  Constitutional: He is oriented to person, place, and time. He appears well-developed and well-nourished. No distress.  Cardiovascular: Normal rate, regular rhythm, normal heart sounds and intact distal pulses. Exam reveals no gallop and no friction rub.  No murmur heard. Pulmonary/Chest: Effort normal and breath sounds normal. No respiratory distress. He has no wheezes. He has no rales. He exhibits no tenderness.  Musculoskeletal: Normal range of motion. He exhibits tenderness (multiple joints ( elbows, wrists, hips, and knees). ). He exhibits no edema or  deformity.  Neurological: He is alert and oriented to person, place, and time.  Skin: Skin is warm and dry. No rash noted. He is not diaphoretic. No erythema. No pallor.  Gray scaly patches on legs and arms.  Silver dollar sized lipoma on lower back  Psychiatric: He has a normal mood and affect. His behavior is normal. Judgment and thought content normal.  Nursing note and vitals reviewed.     Assessment & Plan:  1. Polyarticular arthritis - Concern for psoriatic arthritis. Will refer to rheumatology. Will prescribe mobic. Advised no other nsaids while taking Mobic  - meloxicam (MOBIC) 7.5 MG tablet; Take 1 tablet (7.5 mg total) by mouth daily.  Dispense: 30 tablet; Refill: 1 - Ambulatory referral to Rheumatology  2. Lipoma of torso - Discussed options such as surgery. He is not interested in surgery at this time. Will follow up if it continues to grow or becomes painful   Dorothyann Peng, NP

## 2017-05-04 DIAGNOSIS — M5412 Radiculopathy, cervical region: Secondary | ICD-10-CM | POA: Diagnosis not present

## 2017-05-04 DIAGNOSIS — M542 Cervicalgia: Secondary | ICD-10-CM | POA: Diagnosis not present

## 2017-05-07 ENCOUNTER — Ambulatory Visit: Payer: Self-pay | Admitting: Rheumatology

## 2017-06-09 ENCOUNTER — Ambulatory Visit: Payer: Self-pay | Admitting: Rheumatology

## 2017-10-05 ENCOUNTER — Ambulatory Visit (INDEPENDENT_AMBULATORY_CARE_PROVIDER_SITE_OTHER): Payer: 59 | Admitting: Adult Health

## 2017-10-05 ENCOUNTER — Encounter: Payer: Self-pay | Admitting: Adult Health

## 2017-10-05 VITALS — BP 120/74 | Temp 98.3°F | Ht 69.25 in | Wt 165.0 lb

## 2017-10-05 DIAGNOSIS — Z0000 Encounter for general adult medical examination without abnormal findings: Secondary | ICD-10-CM | POA: Diagnosis not present

## 2017-10-05 DIAGNOSIS — L408 Other psoriasis: Secondary | ICD-10-CM | POA: Diagnosis not present

## 2017-10-05 DIAGNOSIS — E785 Hyperlipidemia, unspecified: Secondary | ICD-10-CM | POA: Diagnosis not present

## 2017-10-05 DIAGNOSIS — Z125 Encounter for screening for malignant neoplasm of prostate: Secondary | ICD-10-CM | POA: Diagnosis not present

## 2017-10-05 DIAGNOSIS — M1990 Unspecified osteoarthritis, unspecified site: Secondary | ICD-10-CM | POA: Diagnosis not present

## 2017-10-05 DIAGNOSIS — M130 Polyarthritis, unspecified: Secondary | ICD-10-CM

## 2017-10-05 LAB — CBC WITH DIFFERENTIAL/PLATELET
Basophils Absolute: 0 10*3/uL (ref 0.0–0.1)
Basophils Relative: 0.2 % (ref 0.0–3.0)
Eosinophils Absolute: 0 10*3/uL (ref 0.0–0.7)
Eosinophils Relative: 0.7 % (ref 0.0–5.0)
HCT: 45.9 % (ref 39.0–52.0)
Hemoglobin: 15.7 g/dL (ref 13.0–17.0)
Lymphocytes Relative: 23.7 % (ref 12.0–46.0)
Lymphs Abs: 1.6 10*3/uL (ref 0.7–4.0)
MCHC: 34.2 g/dL (ref 30.0–36.0)
MCV: 90.9 fl (ref 78.0–100.0)
Monocytes Absolute: 0.7 10*3/uL (ref 0.1–1.0)
Monocytes Relative: 10.4 % (ref 3.0–12.0)
Neutro Abs: 4.3 10*3/uL (ref 1.4–7.7)
Neutrophils Relative %: 65 % (ref 43.0–77.0)
Platelets: 250 10*3/uL (ref 150.0–400.0)
RBC: 5.04 Mil/uL (ref 4.22–5.81)
RDW: 13.4 % (ref 11.5–15.5)
WBC: 6.7 10*3/uL (ref 4.0–10.5)

## 2017-10-05 LAB — PSA: PSA: 1.02 ng/mL (ref 0.10–4.00)

## 2017-10-05 LAB — LIPID PANEL
Cholesterol: 283 mg/dL — ABNORMAL HIGH (ref 0–200)
HDL: 92.6 mg/dL (ref >39.00)
LDL Cholesterol: 166 mg/dL — ABNORMAL HIGH (ref 0–99)
NonHDL: 190.85
Total CHOL/HDL Ratio: 3
Triglycerides: 125 mg/dL (ref 0.0–149.0)
VLDL: 25 mg/dL (ref 0.0–40.0)

## 2017-10-05 LAB — BASIC METABOLIC PANEL
BUN: 15 mg/dL (ref 6–23)
CO2: 30 mEq/L (ref 19–32)
Calcium: 10.3 mg/dL (ref 8.4–10.5)
Chloride: 99 mEq/L (ref 96–112)
Creatinine, Ser: 0.87 mg/dL (ref 0.40–1.50)
GFR: 97.49 mL/min (ref >60.00)
Glucose, Bld: 87 mg/dL (ref 70–99)
Potassium: 4.4 mEq/L (ref 3.5–5.1)
Sodium: 138 mEq/L (ref 135–145)

## 2017-10-05 LAB — HEPATIC FUNCTION PANEL
ALT: 22 U/L (ref 0–53)
AST: 28 U/L (ref 0–37)
Albumin: 4.9 g/dL (ref 3.5–5.2)
Alkaline Phosphatase: 53 U/L (ref 39–117)
Bilirubin, Direct: 0.2 mg/dL (ref 0.0–0.3)
Total Bilirubin: 1.2 mg/dL (ref 0.2–1.2)
Total Protein: 8.1 g/dL (ref 6.0–8.3)

## 2017-10-05 LAB — SEDIMENTATION RATE: Sed Rate: 32 mm/hr — ABNORMAL HIGH (ref 0–20)

## 2017-10-05 LAB — TSH: TSH: 2.83 u[IU]/mL (ref 0.35–4.50)

## 2017-10-05 LAB — HEMOGLOBIN A1C: Hgb A1c MFr Bld: 5.4 % (ref 4.6–6.5)

## 2017-10-05 MED ORDER — MELOXICAM 15 MG PO TABS: 15.0000 mg | ORAL_TABLET | Freq: Every day | ORAL | 1 refills | Status: AC

## 2017-10-05 NOTE — Progress Notes (Signed)
Subjective:    Patient ID: Timothy Hardin, male    DOB: 1965-11-29, 52 y.o.   MRN: [id number]  HPI Patient presents for yearly preventative medicine examination. He is a pleasant 52 year old male who  has a past medical history of Anemia, Arthritis, Elevated liver function tests, Encounter for blood transfusion, adenomatous polyp of colon (03/18/2015), Hyperlipidemia, Plaque psoriasis, and Spinal stenosis in cervical region. He continues to work at [employer].   Hyperlipidemia -not currently prescribed any medication.  He has been working on lifestyle modification. He is open to going on a statin if needed.  Lab Results  Component Value Date   CHOL 267 (H) 07/17/2016   HDL 76.30 07/17/2016   LDLCALC 154 (H) 11/28/2014   LDLDIRECT 135.0 07/17/2016   TRIG 269.0 (H) 07/17/2016   CHOLHDL 3 07/17/2016   Psoriasis - Not currently using anything. He is open to seeing Dermatology for treatment options.   Alcohol Use - is drinking about 3-4 beers a night. He has cut back from 6 beers a night.   Arthritis - Needs refill of Mobic. Has arthritis in his hands. Multiple nodules seen. Has had a negative ANA in the past but no RA test seen in chart.   All immunizations and health maintenance protocols were reviewed with the patient and needed orders were placed.  Appropriate screening laboratory values were ordered for the patient including screening of hyperlipidemia, renal function and hepatic function. If indicated by BPH, a PSA was ordered.  Medication reconciliation,  past medical history, social history, problem list and allergies were reviewed in detail with the patient  Goals were established with regard to weight loss, exercise, and  diet in compliance with medications. He is going to the gym about 4 times a week. He is working on diet.   Wt Readings from Last 3 Encounters:  10/05/17 165 lb (74.8 kg)  02/12/17 170 lb (77.1 kg)  07/17/16 170 lb 8 oz (77.3 kg)   He is UTD on  his colonoscopy   He has no complaints.   Review of Systems  Constitutional: Negative.   HENT: Negative.   Eyes: Negative.   Respiratory: Negative.   Cardiovascular: Negative.   Gastrointestinal: Negative.   Endocrine: Negative.   Genitourinary: Negative.   Musculoskeletal: Positive for arthralgias and joint swelling.  Skin: Negative.   Allergic/Immunologic: Negative.   Neurological: Negative.   Hematological: Negative.   Psychiatric/Behavioral: Negative.   All other systems reviewed and are negative.  Past Medical History:  Diagnosis Date  . Anemia    dx 6 mos. old  . Arthritis   . Elevated liver function tests   . Encounter for blood transfusion    anemia as an infant  . Hx of adenomatous polyp of colon 03/18/2015  . Hyperlipidemia   . Plaque psoriasis   . Spinal stenosis in cervical region     Social History   Socioeconomic History  . Marital status: Single    Spouse name: Not on file  . Number of children: Not on file  . Years of education: Not on file  . Highest education level: Not on file  Occupational History  . Not on file  Social Needs  . Financial resource strain: Not on file  . Food insecurity:    Worry: Not on file    Inability: Not on file  . Transportation needs:    Medical: Not on file    Non-medical: Not on file  Tobacco Use  .  Smoking status: Never Smoker  . Smokeless tobacco: Never Used  Substance and Sexual Activity  . Alcohol use: Yes    Alcohol/week: 10.0 standard drinks    Types: 10 Cans of beer per week    Comment: 6 daily  . Drug use: Yes    Frequency: 7.0 times per week    Types: Marijuana    Comment: daily  . Sexual activity: Not on file  Lifestyle  . Physical activity:    Days per week: Not on file    Minutes per session: Not on file  . Stress: Not on file  Relationships  . Social connections:    Talks on phone: Not on file    Gets together: Not on file    Attends religious service: Not on file    Active member of  club or organization: Not on file    Attends meetings of clubs or organizations: Not on file    Relationship status: Not on file  . Intimate partner violence:    Fear of current or ex partner: Not on file    Emotionally abused: Not on file    Physically abused: Not on file    Forced sexual activity: Not on file  Other Topics Concern  . 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 ANTERIOR CERVICAL DECOMP/DISCECTOMY FUSION  11/23/2011   Procedure: ANTERIOR CERVICAL DECOMPRESSION/DISCECTOMY FUSION 2 LEVELS;  Surgeon: Ophelia Charter, MD;  Location: [facility];  Service: Neurosurgery;  Laterality: N/A;  Cervical four-five Cervical five-six anterior cervical decompression with fusion interbody prothesis plating and bonegraft  . MANDIBLE SURGERY  25 years ago   reduction  . TONSILLECTOMY      Family History  Adopted: Yes  Problem Relation Age of Onset  . Colon cancer Neg Hx     Allergies  Allergen Reactions  . Meperidine Hcl     REACTION: hives  . Hydrocodone-Acetaminophen Nausea Only    Intolerance    Current Outpatient Medications on File Prior to Visit  Medication Sig Dispense Refill  . Cholecalciferol (VITAMIN D) 2000 units tablet Take 2,000 Units by mouth daily. Reported on 03/12/2015    . meloxicam (MOBIC) 7.5 MG tablet Take 1 tablet (7.5 mg total) by mouth daily. 30 tablet 1   No current facility-administered medications on file prior to visit.     There were no vitals taken for this visit.      Objective:   Physical Exam  Constitutional: He is oriented to person, place, and time. He appears well-developed and well-nourished. No distress.  HENT:  Head: Normocephalic and atraumatic.  Right Ear: External ear normal.  Left Ear: External ear normal.  Nose: Nose normal.  Mouth/Throat: Oropharynx is clear and moist. No oropharyngeal  exudate.  Eyes: Pupils are equal, round, and reactive to light. Conjunctivae and EOM are normal. Right eye exhibits no discharge. Left eye exhibits no discharge. No scleral icterus.  Neck: Normal range of motion. Neck supple. No JVD present. No tracheal deviation present. No thyromegaly present.  Cardiovascular: Normal rate, regular rhythm, normal heart sounds and intact distal pulses. Exam reveals no gallop and no friction rub.  No murmur heard. Pulmonary/Chest: Effort normal and breath sounds normal. No stridor. No respiratory distress. He has no wheezes. He has no rales.  He exhibits no tenderness.  Abdominal: Soft. Bowel sounds are normal. He exhibits no distension and no mass. There is no tenderness. There is no rebound and no guarding. No hernia.  Genitourinary:  Genitourinary Comments: Will do PSA   Musculoskeletal: Normal range of motion. He exhibits no edema, tenderness or deformity.  Lymphadenopathy:    He has no cervical adenopathy.  Neurological: He is alert and oriented to person, place, and time. He displays normal reflexes. No cranial nerve deficit or sensory deficit. He exhibits normal muscle tone. Coordination normal.  Painless nodules noted on multiple fingers bilaterally.   Skin: Skin is warm and dry. Capillary refill takes less than 2 seconds. No rash noted. He is not diaphoretic. No erythema. No pallor.  Plaque psoriasis noted on legs, elbows, and lower back/buttock   Psychiatric: He has a normal mood and affect. His behavior is normal. Judgment and thought content normal.  Nursing note and vitals reviewed.     Assessment & Plan:  1. Routine general medical examination at a health care facility - I would like him to cut back on drinking.  - Continue to exercise and eat healthy  - Follow up in one year or sooner if needed - Basic metabolic panel - CBC with Differential/Platelet - Hemoglobin A1c - Hepatic function panel - Lipid panel - TSH  2. Hyperlipidemia,  unspecified hyperlipidemia type - likely start statin   3. PSORIASIS  - Ambulatory referral to Dermatology  4. Arthritis - Will check RA and Sed Rate. Refill Mobic. Consider referral to Rheumatology  - Rheumatoid Factor - Sedimentation Rate - meloxicam (MOBIC) 15 MG tablet; Take 1 tablet (15 mg total) by mouth daily.  Dispense: 90 tablet; Refill: 1  5. Prostate cancer screening  - PSA  Dorothyann Peng, NP

## 2017-10-06 LAB — RHEUMATOID FACTOR: Rhuematoid fact SerPl-aCnc: <14 IU/mL (ref <14)

## 2017-10-12 ENCOUNTER — Other Ambulatory Visit: Payer: Self-pay | Admitting: Family Medicine

## 2017-10-12 MED ORDER — ATORVASTATIN CALCIUM 20 MG PO TABS: 20.0000 mg | ORAL_TABLET | Freq: Every day | ORAL | 3 refills | Status: DC

## 2017-12-28 DIAGNOSIS — L400 Psoriasis vulgaris: Secondary | ICD-10-CM | POA: Diagnosis not present

## 2018-03-29 DIAGNOSIS — D225 Melanocytic nevi of trunk: Secondary | ICD-10-CM | POA: Diagnosis not present

## 2018-03-29 DIAGNOSIS — L400 Psoriasis vulgaris: Secondary | ICD-10-CM | POA: Diagnosis not present

## 2018-03-29 DIAGNOSIS — D485 Neoplasm of uncertain behavior of skin: Secondary | ICD-10-CM | POA: Diagnosis not present

## 2018-06-20 ENCOUNTER — Telehealth: Payer: Self-pay | Admitting: *Deleted

## 2018-06-20 NOTE — Telephone Encounter (Signed)
Copied from [place] [id number]. Topic: Appointment Scheduling - Scheduling Inquiry for Clinic >> Jun 20, 2018  1:47 PM Celene Kras A wrote: Reason for CRM: Pt called stating he believes he has a couple hernias. Pt would like to be seen face to face in office. Please advise.

## 2018-06-21 NOTE — Telephone Encounter (Signed)
Pt scheduled to see Tommi Rumps on 06/22/2018 @ 3:30.

## 2018-06-22 ENCOUNTER — Encounter: Payer: Self-pay | Admitting: Adult Health

## 2018-06-22 ENCOUNTER — Other Ambulatory Visit: Payer: Self-pay

## 2018-06-22 ENCOUNTER — Ambulatory Visit: Payer: 59 | Admitting: Adult Health

## 2018-06-22 VITALS — BP 122/74 | Temp 98.2°F | Wt 170.0 lb

## 2018-06-22 DIAGNOSIS — N509 Disorder of male genital organs, unspecified: Secondary | ICD-10-CM | POA: Diagnosis not present

## 2018-06-22 NOTE — Progress Notes (Signed)
Subjective:    Patient ID: Timothy Hardin, male    DOB: 01/06/1966, 53 y.o.   MRN: [id number]  HPI 53 year old male who  has a past medical history of Anemia, Arthritis, Elevated liver function tests, Encounter for blood transfusion, adenomatous polyp of colon (03/18/2015), Hyperlipidemia, Plaque psoriasis, and Spinal stenosis in cervical region.   He is being evaluated today for concern of hernia.  He reports that over the last for 5 days he feels as though his right testicle "is not hanging as low as it should".  Today he reports that he feels as though his testicle is "back to normal".  Concerned that he may have a hernia.  He denies any pain, redness, warmth, or swelling to the scrotum or testicle.  No abdominal pain, nausea, or vomiting.   Review of Systems See HPI   Past Medical History:  Diagnosis Date  . Anemia    dx 6 mos. old  . Arthritis   . Elevated liver function tests   . Encounter for blood transfusion    anemia as an infant  . Hx of adenomatous polyp of colon 03/18/2015  . Hyperlipidemia   . Plaque psoriasis   . Spinal stenosis in cervical region     Social History   Socioeconomic History  . Marital status: Single    Spouse name: Not on file  . Number of children: Not on file  . Years of education: Not on file  . Highest education level: Not on file  Occupational History  . Not on file  Social Needs  . Financial resource strain: Not on file  . Food insecurity:    Worry: Not on file    Inability: Not on file  . Transportation needs:    Medical: Not on file    Non-medical: Not on file  Tobacco Use  . Smoking status: Never Smoker  . Smokeless tobacco: Never Used  Substance and Sexual Activity  . Alcohol use: Yes    Alcohol/week: 24.0 standard drinks    Types: 24 Cans of beer per week    Comment: 6 daily  . Drug use: Yes    Frequency: 7.0 times per week    Types: Marijuana    Comment: daily  . Sexual activity: Not on file  Lifestyle  . Physical  activity:    Days per week: Not on file    Minutes per session: Not on file  . Stress: Not on file  Relationships  . Social connections:    Talks on phone: Not on file    Gets together: Not on file    Attends religious service: Not on file    Active member of club or organization: Not on file    Attends meetings of clubs or organizations: Not on file    Relationship status: Not on file  . Intimate partner violence:    Fear of current or ex partner: Not on file    Emotionally abused: Not on file    Physically abused: Not on file    Forced sexual activity: Not on file  Other Topics Concern  . 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 ANTERIOR CERVICAL DECOMP/DISCECTOMY FUSION  11/23/2011   Procedure: ANTERIOR CERVICAL DECOMPRESSION/DISCECTOMY FUSION 2 LEVELS;  Surgeon: Dellis Filbert  Flo Shanks, MD;  Location: [facility];  Service: Neurosurgery;  Laterality: N/A;  Cervical four-five Cervical five-six anterior cervical decompression with fusion interbody prothesis plating and bonegraft  . MANDIBLE SURGERY  25 years ago   reduction  . TONSILLECTOMY      Family History  Adopted: Yes  Problem Relation Age of Onset  . Colon cancer Neg Hx     Allergies  Allergen Reactions  . Meperidine Hcl     REACTION: hives  . Hydrocodone-Acetaminophen Nausea Only    Intolerance    Current Outpatient Medications on File Prior to Visit  Medication Sig Dispense Refill  . atorvastatin (LIPITOR) 20 MG tablet Take 1 tablet (20 mg total) by mouth daily. 90 tablet 3   No current facility-administered medications on file prior to visit.     BP 122/74   Temp 98.2 F (36.8 C)   Wt 170 lb (77.1 kg)   BMI 24.92 kg/m       Objective:   Physical Exam Vitals signs and nursing note reviewed.  Abdominal:     General: Abdomen is flat. Bowel sounds are normal. There  is no distension.     Palpations: Abdomen is soft. There is no mass.     Tenderness: There is no abdominal tenderness.     Hernia: No hernia is present. There is no hernia in the right inguinal area or left inguinal area.  Genitourinary:    Penis: Normal and circumcised.      Scrotum/Testes: Normal. Cremasteric reflex is present.        Right: Mass, tenderness, swelling, testicular hydrocele or varicocele not present. Right testis is descended. Cremasteric reflex is present.         Left: Mass, tenderness, swelling, testicular hydrocele or varicocele not present. Left testis is descended. Cremasteric reflex is present.      Epididymis:     Right: Normal.     Left: Normal.  Musculoskeletal: Normal range of motion.  Skin:    General: Skin is warm.     Capillary Refill: Capillary refill takes less than 2 seconds.  Neurological:     General: No focal deficit present.     Mental Status: He is oriented to person, place, and time.  Psychiatric:        Mood and Affect: Mood normal.        Behavior: Behavior normal.        Thought Content: Thought content normal.        Judgment: Judgment normal.       Assessment & Plan:  1. Testicle trouble Possible retractile testicle.  Lee normal right and left testicle.  No hernia noted.  Both testicles properly distended.  Ice follow-up as needed  Dorothyann Peng, NP

## 2018-09-21 ENCOUNTER — Telehealth: Payer: Self-pay | Admitting: Adult Health

## 2018-09-21 MED ORDER — ATORVASTATIN CALCIUM 20 MG PO TABS: 20.0000 mg | ORAL_TABLET | Freq: Every day | ORAL | 0 refills | Status: DC

## 2018-09-21 NOTE — Telephone Encounter (Signed)
Copied from [place] [id number]. Topic: Quick Communication - Rx Refill/Question >> Sep 21, 2018  2:23 PM Leward Quan A wrote: Medication: atorvastatin (LIPITOR) 20 MG tablet   Has the patient contacted their pharmacy? Yes.   (Agent: If no, request that the patient contact the pharmacy for the refill.) (Agent: If yes, when and what did the pharmacy advise?)  Preferred Pharmacy (with phone number or street name): [id number] - [place], [place]. AT [address] [phone] (Phone) [phone] (Fax)    Agent: Please be advised that RX refills may take up to 3 business days. We ask that you follow-up with your pharmacy.

## 2018-09-21 NOTE — Telephone Encounter (Signed)
See request °

## 2018-09-21 NOTE — Telephone Encounter (Signed)
Sent to the pharmacy by e-scribe. 

## 2018-11-04 ENCOUNTER — Ambulatory Visit (INDEPENDENT_AMBULATORY_CARE_PROVIDER_SITE_OTHER): Payer: 59 | Admitting: Adult Health

## 2018-11-04 ENCOUNTER — Other Ambulatory Visit: Payer: Self-pay

## 2018-11-04 ENCOUNTER — Encounter: Payer: Self-pay | Admitting: Adult Health

## 2018-11-04 VITALS — BP 124/82 | HR 76 | Temp 97.8°F | Ht 69.0 in | Wt 170.4 lb

## 2018-11-04 DIAGNOSIS — E785 Hyperlipidemia, unspecified: Secondary | ICD-10-CM

## 2018-11-04 DIAGNOSIS — Z0000 Encounter for general adult medical examination without abnormal findings: Secondary | ICD-10-CM | POA: Diagnosis not present

## 2018-11-04 DIAGNOSIS — Z114 Encounter for screening for human immunodeficiency virus [HIV]: Secondary | ICD-10-CM

## 2018-11-04 DIAGNOSIS — L408 Other psoriasis: Secondary | ICD-10-CM

## 2018-11-04 DIAGNOSIS — Z125 Encounter for screening for malignant neoplasm of prostate: Secondary | ICD-10-CM | POA: Diagnosis not present

## 2018-11-04 LAB — COMPREHENSIVE METABOLIC PANEL
ALT: 40 U/L (ref 0–53)
AST: 35 U/L (ref 0–37)
Albumin: 4.3 g/dL (ref 3.5–5.2)
Alkaline Phosphatase: 39 U/L (ref 39–117)
BUN: 16 mg/dL (ref 6–23)
CO2: 28 mEq/L (ref 19–32)
Calcium: 9.1 mg/dL (ref 8.4–10.5)
Chloride: 101 mEq/L (ref 96–112)
Creatinine, Ser: 0.85 mg/dL (ref 0.40–1.50)
GFR: 93.83 mL/min (ref >60.00)
Glucose, Bld: 77 mg/dL (ref 70–99)
Potassium: 4.1 mEq/L (ref 3.5–5.1)
Sodium: 139 mEq/L (ref 135–145)
Total Bilirubin: 1 mg/dL (ref 0.2–1.2)
Total Protein: 6.7 g/dL (ref 6.0–8.3)

## 2018-11-04 LAB — CBC WITH DIFFERENTIAL/PLATELET
Basophils Absolute: 0 10*3/uL (ref 0.0–0.1)
Basophils Relative: 0.1 % (ref 0.0–3.0)
Eosinophils Absolute: 0.1 10*3/uL (ref 0.0–0.7)
Eosinophils Relative: 1.5 % (ref 0.0–5.0)
HCT: 40.9 % (ref 39.0–52.0)
Hemoglobin: 13.8 g/dL (ref 13.0–17.0)
Lymphocytes Relative: 27 % (ref 12.0–46.0)
Lymphs Abs: 1.5 10*3/uL (ref 0.7–4.0)
MCHC: 33.8 g/dL (ref 30.0–36.0)
MCV: 92.4 fl (ref 78.0–100.0)
Monocytes Absolute: 0.6 10*3/uL (ref 0.1–1.0)
Monocytes Relative: 10.5 % (ref 3.0–12.0)
Neutro Abs: 3.4 10*3/uL (ref 1.4–7.7)
Neutrophils Relative %: 60.9 % (ref 43.0–77.0)
Platelets: 198 10*3/uL (ref 150.0–400.0)
RBC: 4.43 Mil/uL (ref 4.22–5.81)
RDW: 14.2 % (ref 11.5–15.5)
WBC: 5.5 10*3/uL (ref 4.0–10.5)

## 2018-11-04 LAB — LDL CHOLESTEROL, DIRECT: Direct LDL: 57 mg/dL

## 2018-11-04 LAB — LIPID PANEL
Cholesterol: 170 mg/dL (ref 0–200)
HDL: 73.4 mg/dL (ref >39.00)
NonHDL: 96.18
Total CHOL/HDL Ratio: 2
Triglycerides: 351 mg/dL — ABNORMAL HIGH (ref 0.0–149.0)
VLDL: 70.2 mg/dL — ABNORMAL HIGH (ref 0.0–40.0)

## 2018-11-04 LAB — PSA: PSA: 0.87 ng/mL (ref 0.10–4.00)

## 2018-11-04 LAB — TSH: TSH: 1.82 u[IU]/mL (ref 0.35–4.50)

## 2018-11-04 MED ORDER — CALCIPOTRIENE-BETAMETH DIPROP 0.005-0.064 % EX OINT: TOPICAL_OINTMENT | Freq: Every day | CUTANEOUS | 3 refills | Status: DC

## 2018-11-04 NOTE — Progress Notes (Signed)
Subjective:    Patient ID: Timothy Hardin, male    DOB: 18-Apr-1965, 53 y.o.   MRN: [id number]  HPI  Patient presents for yearly preventative medicine examination. He is a pleasant 53 year old male who  has a past medical history of Anemia, Arthritis, Elevated liver function tests, Encounter for blood transfusion, adenomatous polyp of colon (03/18/2015), Hyperlipidemia, Plaque psoriasis, and Spinal stenosis in cervical region.  Hyperlipidemia -placed on Lipitor 20 mg about a year ago.  He denies algia or fatigue Lab Results  Component Value Date   CHOL 283 (H) 10/05/2017   HDL 92.60 10/05/2017   LDLCALC 166 (H) 10/05/2017   LDLDIRECT 135.0 07/17/2016   TRIG 125.0 10/05/2017   CHOLHDL 3 10/05/2017   Psoriasis-not currently using prescription medications.She has been seen by dermatology in the past and did not like how the medication made him feel  All immunizations and health maintenance protocols were reviewed with the patient and needed orders were placed. He is due for flu vaccination - refuses  Appropriate screening laboratory values were ordered for the patient including screening of hyperlipidemia, renal function and hepatic function. If indicated by BPH, a PSA was ordered.  Medication reconciliation,  past medical history, social history, problem list and allergies were reviewed in detail with the patient  Goals were established with regard to weight loss, exercise, and  diet in compliance with medications. He going to the gym multiple times per week and has been eating healthier.   Wt Readings from Last 3 Encounters:  11/04/18 170 lb 6.4 oz (77.3 kg)  06/22/18 170 lb (77.1 kg)  10/05/17 165 lb (74.8 kg)   He is up-to-date on routine screening colonoscopy  Review of Systems  Constitutional: Negative.   HENT: Negative.   Eyes: Negative.   Respiratory: Negative.   Cardiovascular: Negative.   Gastrointestinal: Negative.   Endocrine: Negative.   Genitourinary: Negative.    Musculoskeletal: Negative.   Skin: Positive for rash.  Allergic/Immunologic: Negative.   Neurological: Negative.   Hematological: Negative.   Psychiatric/Behavioral: Negative.   All other systems reviewed and are negative.  Past Medical History:  Diagnosis Date  . Anemia    dx 6 mos. old  . Arthritis   . Elevated liver function tests   . Encounter for blood transfusion    anemia as an infant  . Hx of adenomatous polyp of colon 03/18/2015  . Hyperlipidemia   . Plaque psoriasis   . Spinal stenosis in cervical region     Social History   Socioeconomic History  . Marital status: Single    Spouse name: Not on file  . Number of children: Not on file  . Years of education: Not on file  . Highest education level: Not on file  Occupational History  . Not on file  Social Needs  . Financial resource strain: Not on file  . Food insecurity    Worry: Not on file    Inability: Not on file  . Transportation needs    Medical: Not on file    Non-medical: Not on file  Tobacco Use  . Smoking status: Never Smoker  . Smokeless tobacco: Never Used  Substance and Sexual Activity  . Alcohol use: Yes    Alcohol/week: 24.0 standard drinks    Types: 24 Cans of beer per week    Comment: 6 daily  . Drug use: Yes    Frequency: 7.0 times per week    Types: Marijuana    Comment:  daily  . Sexual activity: Not on file  Lifestyle  . Physical activity    Days per week: Not on file    Minutes per session: Not on file  . Stress: Not on file  Relationships  . Social Herbalist on phone: Not on file    Gets together: Not on file    Attends religious service: Not on file    Active member of club or organization: Not on file    Attends meetings of clubs or organizations: Not on file    Relationship status: Not on file  . Intimate partner violence    Fear of current or ex partner: Not on file    Emotionally abused: Not on file    Physically abused: Not on file    Forced sexual  activity: Not on file  Other Topics Concern  . 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 ANTERIOR CERVICAL DECOMP/DISCECTOMY FUSION  11/23/2011   Procedure: ANTERIOR CERVICAL DECOMPRESSION/DISCECTOMY FUSION 2 LEVELS;  Surgeon: Ophelia Charter, MD;  Location: [facility];  Service: Neurosurgery;  Laterality: N/A;  Cervical four-five Cervical five-six anterior cervical decompression with fusion interbody prothesis plating and bonegraft  . MANDIBLE SURGERY  25 years ago   reduction  . TONSILLECTOMY      Family History  Adopted: Yes  Problem Relation Age of Onset  . Colon cancer Neg Hx     Allergies  Allergen Reactions  . Meperidine Hcl     REACTION: hives  . Hydrocodone-Acetaminophen Nausea Only    Intolerance    Current Outpatient Medications on File Prior to Visit  Medication Sig Dispense Refill  . atorvastatin (LIPITOR) 20 MG tablet Take 1 tablet (20 mg total) by mouth daily. 90 tablet 0   No current facility-administered medications on file prior to visit.     BP 124/82 (BP Location: Left Arm, Patient Position: Sitting, Cuff Size: Normal)   Pulse 76   Temp 97.8 F (36.6 C) (Temporal)   Ht 5\' 9"  (1.753 m)   Wt 170 lb 6.4 oz (77.3 kg)   SpO2 99%   BMI 25.16 kg/m       Objective:   Physical Exam Vitals signs and nursing note reviewed.  Constitutional:      General: He is not in acute distress.    Appearance: Normal appearance. He is normal weight. He is not diaphoretic.  HENT:     Head: Normocephalic and atraumatic.     Right Ear: Tympanic membrane, ear canal and external ear normal. There is no impacted cerumen.     Left Ear: Tympanic membrane, ear canal and external ear normal. There is no impacted cerumen.     Nose: Nose normal. No congestion or rhinorrhea.     Mouth/Throat:     Mouth: Mucous membranes are moist.      Pharynx: Oropharynx is clear. No oropharyngeal exudate or posterior oropharyngeal erythema.  Eyes:     General: No scleral icterus.       Right eye: No discharge.        Left eye: No discharge.     Extraocular Movements: Extraocular movements intact.     Conjunctiva/sclera: Conjunctivae normal.     Pupils: Pupils are equal, round, and reactive to light.  Neck:     Musculoskeletal: Normal range of motion and neck supple.     Thyroid: No thyromegaly.     Vascular: No JVD.     Trachea: No tracheal deviation.  Cardiovascular:     Rate and Rhythm: Normal rate and regular rhythm.     Heart sounds: Normal heart sounds. No murmur. No friction rub. No gallop.   Pulmonary:     Effort: Pulmonary effort is normal. No respiratory distress.     Breath sounds: Normal breath sounds. No stridor. No wheezing, rhonchi or rales.  Chest:     Chest wall: No tenderness.  Abdominal:     General: Bowel sounds are normal. There is no distension.     Palpations: Abdomen is soft. There is no mass.     Tenderness: There is no abdominal tenderness. There is no right CVA tenderness, left CVA tenderness, guarding or rebound.     Hernia: No hernia is present.  Musculoskeletal: Normal range of motion.        General: No swelling, tenderness, deformity or signs of injury.     Right lower leg: No edema.     Left lower leg: No edema.  Lymphadenopathy:     Cervical: No cervical adenopathy.  Skin:    General: Skin is warm and dry.     Capillary Refill: Capillary refill takes less than 2 seconds.     Coloration: Skin is not jaundiced or pale.     Findings: Rash (areas of plaque psoriais on legs, elbows, neck, stomach, back) present. No bruising, erythema or lesion.  Neurological:     General: No focal deficit present.     Mental Status: He is alert and oriented to person, place, and time. Mental status is at baseline.     Cranial Nerves: No cranial nerve deficit.     Sensory: No sensory deficit.     Motor: No  weakness or abnormal muscle tone.     Coordination: Coordination normal.     Gait: Gait normal.     Deep Tendon Reflexes: Reflexes are normal and symmetric. Reflexes normal.  Psychiatric:        Mood and Affect: Mood normal.        Behavior: Behavior normal.        Thought Content: Thought content normal.        Judgment: Judgment normal.       Assessment & Plan:  1. Routine general medical examination at a health care facility - Continue to exercise and eat healthy  - Follow up in one year or sooner if needed - CBC with Differential/Platelet - Comprehensive metabolic panel - Lipid panel - TSH  2. Hyperlipidemia, unspecified hyperlipidemia type - Consider increase in statin  - CBC with Differential/Platelet - Comprehensive metabolic panel - Lipid panel - TSH  3. Prostate cancer screening  - PSA  4. PSORIASIS - Does not want to return to dermatology at this time  - calcipotriene-betamethasone (TACLONEX) ointment; Apply topically daily.  Dispense: 100 g; Refill: 3  5. Encounter for screening for HIV  - HIV Antibody (routine testing w rflx)  Dorothyann Peng, NP

## 2018-11-07 LAB — HIV ANTIBODY (ROUTINE TESTING W REFLEX): HIV 1&2 Ab, 4th Generation: NONREACTIVE

## 2019-03-13 ENCOUNTER — Ambulatory Visit (HOSPITAL_COMMUNITY)
Admission: EM | Admit: 2019-03-13 | Discharge: 2019-03-13 | Disposition: A | Discharge status: Home / Self Care | Payer: BC Managed Care – PPO | Source: Home / Self Care | Attending: Family Medicine | Admitting: Family Medicine

## 2019-03-13 ENCOUNTER — Encounter (HOSPITAL_COMMUNITY): Payer: Self-pay

## 2019-03-13 ENCOUNTER — Other Ambulatory Visit: Payer: Self-pay

## 2019-03-13 ENCOUNTER — Emergency Department (HOSPITAL_COMMUNITY): Payer: BC Managed Care – PPO

## 2019-03-13 ENCOUNTER — Emergency Department (HOSPITAL_COMMUNITY)
Admission: EM | Admit: 2019-03-13 | Discharge: 2019-03-13 | Disposition: A | Discharge status: Home / Self Care | Payer: BC Managed Care – PPO | Attending: Emergency Medicine | Admitting: Emergency Medicine

## 2019-03-13 DIAGNOSIS — Z20822 Contact with and (suspected) exposure to covid-19: Secondary | ICD-10-CM | POA: Insufficient documentation

## 2019-03-13 DIAGNOSIS — F1290 Cannabis use, unspecified, uncomplicated: Secondary | ICD-10-CM | POA: Insufficient documentation

## 2019-03-13 DIAGNOSIS — Z79899 Other long term (current) drug therapy: Secondary | ICD-10-CM | POA: Diagnosis not present

## 2019-03-13 DIAGNOSIS — Z789 Other specified health status: Secondary | ICD-10-CM

## 2019-03-13 DIAGNOSIS — R112 Nausea with vomiting, unspecified: Secondary | ICD-10-CM | POA: Diagnosis not present

## 2019-03-13 DIAGNOSIS — R1013 Epigastric pain: Secondary | ICD-10-CM | POA: Diagnosis not present

## 2019-03-13 DIAGNOSIS — R1010 Upper abdominal pain, unspecified: Secondary | ICD-10-CM

## 2019-03-13 DIAGNOSIS — R030 Elevated blood-pressure reading, without diagnosis of hypertension: Secondary | ICD-10-CM

## 2019-03-13 DIAGNOSIS — R7989 Other specified abnormal findings of blood chemistry: Secondary | ICD-10-CM | POA: Diagnosis not present

## 2019-03-13 DIAGNOSIS — K2980 Duodenitis without bleeding: Secondary | ICD-10-CM

## 2019-03-13 DIAGNOSIS — R197 Diarrhea, unspecified: Secondary | ICD-10-CM | POA: Diagnosis not present

## 2019-03-13 DIAGNOSIS — F1090 Alcohol use, unspecified, uncomplicated: Secondary | ICD-10-CM

## 2019-03-13 LAB — CBC
HCT: 50.1 % (ref 39.0–52.0)
Hemoglobin: 17.3 g/dL — ABNORMAL HIGH (ref 13.0–17.0)
MCH: 30.9 pg (ref 26.0–34.0)
MCHC: 34.5 g/dL (ref 30.0–36.0)
MCV: 89.5 fL (ref 80.0–100.0)
Platelets: 266 10*3/uL (ref 150–400)
RBC: 5.6 MIL/uL (ref 4.22–5.81)
RDW: 12.5 % (ref 11.5–15.5)
WBC: 14.1 10*3/uL — ABNORMAL HIGH (ref 4.0–10.5)
nRBC: 0 % (ref 0.0–0.2)

## 2019-03-13 LAB — COMPREHENSIVE METABOLIC PANEL
ALT: 44 U/L (ref 0–44)
AST: 35 U/L (ref 15–41)
Albumin: 4.7 g/dL (ref 3.5–5.0)
Alkaline Phosphatase: 49 U/L (ref 38–126)
Anion gap: 16 — ABNORMAL HIGH (ref 5–15)
BUN: 15 mg/dL (ref 6–20)
CO2: 16 mmol/L — ABNORMAL LOW (ref 22–32)
Calcium: 10.2 mg/dL (ref 8.9–10.3)
Chloride: 105 mmol/L (ref 98–111)
Creatinine, Ser: 0.95 mg/dL (ref 0.61–1.24)
GFR calc Af Amer: >60 mL/min (ref >60)
GFR calc non Af Amer: >60 mL/min (ref >60)
Glucose, Bld: 141 mg/dL — ABNORMAL HIGH (ref 70–99)
Potassium: 3.6 mmol/L (ref 3.5–5.1)
Sodium: 137 mmol/L (ref 135–145)
Total Bilirubin: 1.6 mg/dL — ABNORMAL HIGH (ref 0.3–1.2)
Total Protein: 7.8 g/dL (ref 6.5–8.1)

## 2019-03-13 LAB — LIPASE, BLOOD: Lipase: 24 U/L (ref 11–51)

## 2019-03-13 LAB — ETHANOL: Alcohol, Ethyl (B): <10 mg/dL (ref <10)

## 2019-03-13 LAB — POC SARS CORONAVIRUS 2 AG -  ED: SARS Coronavirus 2 Ag: NEGATIVE

## 2019-03-13 MED ORDER — ONDANSETRON 8 MG PO TBDP: 8.0000 mg | ORAL_TABLET | Freq: Three times a day (TID) | ORAL | 0 refills | Status: DC | PRN

## 2019-03-13 MED ORDER — PROMETHAZINE HCL 25 MG RE SUPP: 25.0000 mg | Freq: Four times a day (QID) | RECTAL | 0 refills | Status: DC | PRN

## 2019-03-13 MED ORDER — CAPSAICIN 0.025 % EX CREA
TOPICAL_CREAM | Freq: Once | CUTANEOUS | Status: AC
  Filled 2019-03-13: qty 60

## 2019-03-13 MED ORDER — ONDANSETRON HCL 4 MG/2ML IJ SOLN
4.0000 mg | Freq: Once | INTRAMUSCULAR | Status: AC
  Administered 2019-03-13: 19:00:00 4 mg via INTRAVENOUS
  Filled 2019-03-13: qty 2

## 2019-03-13 MED ORDER — MORPHINE SULFATE (PF) 4 MG/ML IV SOLN
INTRAVENOUS | Status: AC
  Filled 2019-03-13: qty 1

## 2019-03-13 MED ORDER — ONDANSETRON 4 MG PO TBDP
4.0000 mg | ORAL_TABLET | Freq: Once | ORAL | Status: AC
  Administered 2019-03-13: 4 mg via ORAL

## 2019-03-13 MED ORDER — FAMOTIDINE 20 MG PO TABS: 20.0000 mg | ORAL_TABLET | Freq: Two times a day (BID) | ORAL | 0 refills | Status: DC

## 2019-03-13 MED ORDER — METOCLOPRAMIDE HCL 5 MG/ML IJ SOLN
10.0000 mg | Freq: Once | INTRAMUSCULAR | Status: AC
  Administered 2019-03-13: 20:00:00 10 mg via INTRAVENOUS
  Filled 2019-03-13: qty 2

## 2019-03-13 MED ORDER — DICYCLOMINE HCL 10 MG PO CAPS
20.0000 mg | ORAL_CAPSULE | Freq: Once | ORAL | Status: AC
  Administered 2019-03-13: 20 mg via ORAL
  Filled 2019-03-13: qty 2

## 2019-03-13 MED ORDER — ONDANSETRON 4 MG PO TBDP
ORAL_TABLET | ORAL | Status: AC
  Filled 2019-03-13: qty 1

## 2019-03-13 MED ORDER — ONDANSETRON 4 MG PO TBDP: 4.0000 mg | ORAL_TABLET | Freq: Three times a day (TID) | ORAL | 0 refills | Status: DC | PRN

## 2019-03-13 MED ORDER — SODIUM CHLORIDE 0.9 % IV BOLUS
1000.0000 mL | Freq: Once | INTRAVENOUS | Status: AC
  Administered 2019-03-13: 1000 mL via INTRAVENOUS

## 2019-03-13 MED ORDER — PANTOPRAZOLE SODIUM 20 MG PO TBEC: 20.0000 mg | DELAYED_RELEASE_TABLET | Freq: Every day | ORAL | 1 refills | Status: DC

## 2019-03-13 MED ORDER — DICYCLOMINE HCL 20 MG PO TABS: 20.0000 mg | ORAL_TABLET | Freq: Two times a day (BID) | ORAL | 0 refills | Status: DC

## 2019-03-13 MED ORDER — MORPHINE SULFATE (PF) 4 MG/ML IV SOLN
4.0000 mg | Freq: Once | INTRAVENOUS | Status: AC
  Administered 2019-03-13: 4 mg via INTRAVENOUS
  Filled 2019-03-13: qty 1

## 2019-03-13 MED ORDER — IOHEXOL 300 MG/ML  SOLN
100.0000 mL | Freq: Once | INTRAMUSCULAR | Status: AC | PRN
  Administered 2019-03-13: 100 mL via INTRAVENOUS

## 2019-03-13 MED ORDER — MORPHINE SULFATE (PF) 2 MG/ML IV SOLN
4.0000 mg | Freq: Once | INTRAVENOUS | Status: AC
  Administered 2019-03-13: 4 mg via INTRAMUSCULAR

## 2019-03-13 NOTE — Discharge Instructions (Addendum)
I am sending you to the Emergency Department for further evaluation with the concern that you may have pancreatitis.  Meds ordered this encounter  Medications   ondansetron (ZOFRAN-ODT) disintegrating tablet 4 mg   morphine 2 MG/ML injection 4 mg

## 2019-03-13 NOTE — ED Triage Notes (Signed)
Pt reports severe abd pain, n/v/d for the past 3 days. Sent here by UC for further eval.

## 2019-03-13 NOTE — ED Notes (Signed)
Patient is being discharged from the [facility] and sent to the Emergency Department via POV and daughterby staff. Per Dr. Mannie Stabile patient is stable but in need of higher level of care due to abd pain, n/v. Patient is aware and verbalizes understanding of plan of care.  Vitals:   03/13/19 1345  BP: (!) 145/88  Pulse: 89  Temp: (!) 97.4 F (36.3 C)  SpO2: 100%

## 2019-03-13 NOTE — ED Notes (Signed)
Patient made aware that we need urine specimen.  

## 2019-03-13 NOTE — Discharge Instructions (Addendum)
  Diet: Start with a clear liquid diet, progressed to a full liquid diet, and then bland solids as you are able. Please adhere to the enclosed dietary suggestions.  In general, avoid NSAIDs (i.e. ibuprofen, naproxen, etc.), caffeine, alcohol, spicy foods, fatty foods, or any other foods that seem to cause your symptoms to arise.  Protonix: Take this medication daily, 20-30 minutes prior to your first meal, for the next 8 weeks.  Continue to take this medication even if you begin to feel better.  Pepcid: Take this medication twice a day for the next 5 days.  Dicyclomine: Dicyclomine (generic for Bentyl) is what is known as an antispasmodic and is intended to help reduce abdominal discomfort.  Nausea/vomiting: Use the ondansetron (generic for Zofran) for nausea or vomiting.  This medication may not prevent all vomiting or nausea, but can help facilitate better hydration. Things that can help with nausea/vomiting also include peppermint/menthol candies, vitamin B12, and ginger. Promethazine: Promethazine (generic for Phenergan) has been prescribed in suppository form for vomiting that is unresponsive to the Zofran.  Do not take these medications at the same time, rather try the Zofran first and if this fails, then try the promethazine.  Follow-up: Please follow-up with the gastroenterologist on this matter.  Call to make an appointment. We also recommend following up with a primary care provider regarding alcohol cessation support and counseling.  Return: Return to the ED for significantly worsening symptoms, persistent vomiting, persistent fever, vomiting blood, blood in the stools, dark stools, or any other major concerns.  For prescription assistance, may try using prescription discount sites or apps, such as goodrx.com

## 2019-03-13 NOTE — ED Triage Notes (Signed)
Given pt. UA cup for specimen , unable to go right now. Per pt.
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
## 2019-03-13 NOTE — ED Triage Notes (Signed)
Pt states n/v/d, HA, chills, abd pain x 36 hours. Denies sore throat, SOB, CP, loss of taste/smell.  Denies blood/coffe ground emesis, dysuria sx.  Last emesis yesterday.

## 2019-03-13 NOTE — ED Notes (Signed)
Transport to [facility]

## 2019-03-13 NOTE — ED Notes (Signed)
[phone] PATTY Traub ' [place]   SISTER CALL PLEASE FOR AN UPDATE   CHARLIE Fulfer DAUGHTER - [phone]  PLEASE UPDATE THE DAUGHTER .

## 2019-03-15 LAB — NOVEL CORONAVIRUS, NAA (HOSP ORDER, SEND-OUT TO REF LAB; TAT 18-24 HRS)
SARS-CoV-2, NAA: NOT DETECTED
SARS-CoV-2, NAA: NOT DETECTED

## 2019-03-15 NOTE — ED Provider Notes (Signed)
[facility]   [id number] 03/13/19 Arrival Time: [id number]  ASSESSMENT & PLAN:  1. Intractable vomiting with nausea, unspecified vomiting type   2. Pain of upper abdomen   3. Heavy alcohol use   4. Elevated blood pressure reading without diagnosis of hypertension     He is hunched over in pain here. After discussion, and given the amount of pain he is in, he agrees that the ED is the best place for further evaluation.  Meds ordered this encounter  Medications  . DISCONTD: ondansetron (ZOFRAN-ODT) 4 MG disintegrating tablet    Sig: Take 1 tablet (4 mg total) by mouth every 8 (eight) hours as needed for nausea or vomiting.    Dispense:  15 tablet    Refill:  0  . ondansetron (ZOFRAN-ODT) disintegrating tablet 4 mg  . morphine 2 MG/ML injection 4 mg   Taken to ED via private vehicle. Stable upon discharge.    Discharge Instructions      I am sending you to the Emergency Department for further evaluation with the concern that you may have pancreatitis.  Meds ordered this encounter  Medications  . ondansetron (ZOFRAN-ODT) disintegrating tablet 4 mg  . morphine 2 MG/ML injection 4 mg       Follow-up Information    Go to  [facility].   Specialty: Emergency Medicine Contact information: [address] [id number] [place] [phone]          May f/u for BP recheck when feeling better. Reviewed expectations re: course of current medical issues. Questions answered. Outlined signs and symptoms indicating need for more acute intervention. Patient verbalized understanding. After Visit Summary given.   SUBJECTIVE: History from: patient. Timothy Hardin is a 54 y.o. male who presents with complaint of persistent epigastric abdominal pain. Onset gradual, 2-3 d ago. With nausea and vomiting and watery diarrhea; no blood reported. Pain described as aching and cramping; upper abdomen; poorly  localized; affecting sleep. Reports normal flatus. Symptoms are unchanged since beginning. Fever: absent. Aggravating factors: have not been identified. Alleviating factors: have not been identified. Associated symptoms: fatigue. He denies arthralgias, belching, dysuria and myalgias. Appetite: decreased. PO intake: decreased. Ambulatory without assistance. Urinary symptoms: none. History of similar: no. No sick contacts known. Alcohol use includes 4-5 beers each night. Also smokes THC. OTC treatment: none.   Past Surgical History:  Procedure Laterality Date  . ANTERIOR CERVICAL DECOMP/DISCECTOMY FUSION  11/23/2011   Procedure: ANTERIOR CERVICAL DECOMPRESSION/DISCECTOMY FUSION 2 LEVELS;  Surgeon: Ophelia Charter, MD;  Location: [facility];  Service: Neurosurgery;  Laterality: N/A;  Cervical four-five Cervical five-six anterior cervical decompression with fusion interbody prothesis plating and bonegraft  . MANDIBLE SURGERY  25 years ago   reduction  . TONSILLECTOMY     Increased blood pressure noted today. Reports that he has not been treated for hypertension in the past.  He reports no chest pain on exertion, no dyspnea on exertion, no swelling of ankles, no orthostatic dizziness or lightheadedness, no orthopnea or paroxysmal nocturnal dyspnea, no palpitations and no intermittent claudication symptoms.   OBJECTIVE:  Vitals:   03/13/19 1345  BP: (!) 145/88  Pulse: 89  Temp: (!) 97.4 F (36.3 C)  TempSrc: Oral  SpO2: 100%    General appearance: alert, oriented, hunched over chair secondary to reported abdominal pain HEENT: [place]; AT; oropharynx moist Lungs: unlabored respirations Abdomen: soft; poorly localized epigastric TTP; without guarding or rebound tenderness Extremities: without LE  edema; symmetrical; without gross deformities Skin: warm and dry Neurologic: normal gait Psychological: alert and cooperative; normal mood and affect  Labs:  Labs Reviewed  NOVEL CORONAVIRUS,  NAA (HOSP ORDER, SEND-OUT TO REF LAB; TAT 18-24 HRS)     Allergies  Allergen Reactions  . Meperidine Hcl     REACTION: hives  . Hydrocodone-Acetaminophen Nausea Only    Intolerance                                               Past Medical History:  Diagnosis Date  . Anemia    dx 6 mos. old  . Arthritis   . Elevated liver function tests   . Encounter for blood transfusion    anemia as an infant  . Hx of adenomatous polyp of colon 03/18/2015  . Hyperlipidemia   . Plaque psoriasis   . Spinal stenosis in cervical region    Social History   Socioeconomic History  . Marital status: Single    Spouse name: Not on file  . Number of children: Not on file  . Years of education: Not on file  . Highest education level: Not on file  Occupational History  . Not on file  Tobacco Use  . Smoking status: Never Smoker  . Smokeless tobacco: Never Used  Substance and Sexual Activity  . Alcohol use: Yes    Alcohol/week: 24.0 standard drinks    Types: 24 Cans of beer per week    Comment: 6 daily  . Drug use: Yes    Frequency: 7.0 times per week    Types: Marijuana    Comment: daily  . Sexual activity: Not on file  Other Topics Concern  . Not on file  Social History Narrative   Divorced, 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               Social Determinants of Health   Financial Resource Strain:   . Difficulty of Paying Living Expenses: Not on file  Food Insecurity:   . Worried About Charity fundraiser in the Last Year: Not on file  . Ran Out of Food in the Last Year: Not on file  Transportation Needs:   . Lack of Transportation (Medical): Not on file  . Lack of Transportation (Non-Medical): Not on file  Physical Activity:   . Days of Exercise per Week: Not on file  . Minutes of Exercise per Session: Not on file  Stress:   . Feeling of Stress : Not on file  Social Connections:   . Frequency of Communication with Friends and Family: Not on file  .  Frequency of Social Gatherings with Friends and Family: Not on file  . Attends Religious Services: Not on file  . Active Member of Clubs or Organizations: Not on file  . Attends Archivist Meetings: Not on file  . Marital Status: Not on file  Intimate Partner Violence:   . Fear of Current or Ex-Partner: Not on file  . Emotionally Abused: Not on file  . Physically Abused: Not on file  . Sexually Abused: Not on file   Family History  Adopted: Yes  Problem Relation Age of Onset  . Colon cancer Neg Hx      Vanessa Kick, MD 03/15/19 [phone]

## 2019-03-16 ENCOUNTER — Other Ambulatory Visit: Payer: Self-pay | Admitting: Adult Health

## 2019-03-16 DIAGNOSIS — R933 Abnormal findings on diagnostic imaging of other parts of digestive tract: Secondary | ICD-10-CM | POA: Diagnosis not present

## 2019-03-16 DIAGNOSIS — Z86010 Personal history of colonic polyps: Secondary | ICD-10-CM | POA: Diagnosis not present

## 2019-03-16 DIAGNOSIS — K219 Gastro-esophageal reflux disease without esophagitis: Secondary | ICD-10-CM | POA: Diagnosis not present

## 2019-03-20 ENCOUNTER — Telehealth: Payer: Self-pay | Admitting: Adult Health

## 2019-03-20 NOTE — Telephone Encounter (Signed)
Medication Refill: Taclonex ointment\  Pharmacy: [facility] [address]  Pt can be reached at [phone]

## 2019-03-21 ENCOUNTER — Other Ambulatory Visit: Payer: Self-pay

## 2019-03-21 DIAGNOSIS — L408 Other psoriasis: Secondary | ICD-10-CM

## 2019-03-21 MED ORDER — CALCIPOTRIENE-BETAMETH DIPROP 0.005-0.064 % EX OINT: TOPICAL_OINTMENT | Freq: Every day | CUTANEOUS | 3 refills | Status: DC

## 2019-03-21 NOTE — Telephone Encounter (Signed)
[facility]

## 2019-04-06 ENCOUNTER — Telehealth: Payer: Self-pay | Admitting: Family Medicine

## 2019-04-06 NOTE — Telephone Encounter (Signed)
Prior authorization request was submitted to Cover My Meds.  Currently waiting on response.  Key: [id number]

## 2019-04-07 ENCOUNTER — Other Ambulatory Visit: Payer: Self-pay

## 2019-04-07 NOTE — Telephone Encounter (Signed)
Medication was denied.  Must try and fail betamethasone cream or lotion and/or enstilar foam.

## 2019-04-11 MED ORDER — BETAMETHASONE DIPROPIONATE 0.05 % EX CREA: TOPICAL_CREAM | Freq: Two times a day (BID) | CUTANEOUS | 3 refills | Status: DC

## 2019-04-11 NOTE — Telephone Encounter (Signed)
Left a message for a return call.

## 2019-04-11 NOTE — Telephone Encounter (Signed)
Pt returned phone call, would like a call back.  

## 2019-04-11 NOTE — Addendum Note (Signed)
Addended by: Apolinar Junes on: 04/11/2019 12:18 PM   Modules accepted: Orders

## 2019-04-11 NOTE — Telephone Encounter (Signed)
betamethasone cream sent in

## 2019-04-12 NOTE — Telephone Encounter (Signed)
Tried to reach the pt.  He was not in a good area for cell reception.  I could hear him but he could not hear me.  I will try again at a later time.

## 2019-04-12 NOTE — Telephone Encounter (Signed)
Pt notified new rx has been sent to the pharmacy.  Nothing further needed.

## 2019-05-23 ENCOUNTER — Telehealth: Payer: Self-pay | Admitting: Adult Health

## 2019-05-23 NOTE — Telephone Encounter (Signed)
Left a message informing the pt that he has refills on file and should call the pharmacy.  Nothing further needed.

## 2019-05-23 NOTE — Telephone Encounter (Signed)
Medication Refill: betamethasone cream  Pharmacy: [facility] [address] FAX: [phone]   Pt states that he ran out pretty quickly and would like to know if 2 tubes of this cream can be sent in?   Pt can be reached at [phone]

## 2019-06-11 ENCOUNTER — Other Ambulatory Visit: Payer: Self-pay | Admitting: Adult Health

## 2019-06-13 NOTE — Telephone Encounter (Signed)
SENT TO THE PHARMACY BY E-SCRIBE. 

## 2019-11-30 ENCOUNTER — Other Ambulatory Visit: Payer: Self-pay | Admitting: Adult Health

## 2019-12-03 ENCOUNTER — Other Ambulatory Visit: Payer: Self-pay | Admitting: Adult Health

## 2020-01-19 ENCOUNTER — Other Ambulatory Visit: Payer: Self-pay

## 2020-01-19 ENCOUNTER — Encounter: Payer: Self-pay | Admitting: Adult Health

## 2020-01-19 ENCOUNTER — Ambulatory Visit (INDEPENDENT_AMBULATORY_CARE_PROVIDER_SITE_OTHER): Payer: BC Managed Care – PPO | Admitting: Adult Health

## 2020-01-19 VITALS — BP 120/80 | HR 69 | Temp 98.5°F | Ht 69.0 in | Wt 161.2 lb

## 2020-01-19 DIAGNOSIS — Z125 Encounter for screening for malignant neoplasm of prostate: Secondary | ICD-10-CM | POA: Diagnosis not present

## 2020-01-19 DIAGNOSIS — L408 Other psoriasis: Secondary | ICD-10-CM | POA: Diagnosis not present

## 2020-01-19 DIAGNOSIS — E785 Hyperlipidemia, unspecified: Secondary | ICD-10-CM

## 2020-01-19 DIAGNOSIS — Z0000 Encounter for general adult medical examination without abnormal findings: Secondary | ICD-10-CM | POA: Diagnosis not present

## 2020-01-19 DIAGNOSIS — Z23 Encounter for immunization: Secondary | ICD-10-CM | POA: Diagnosis not present

## 2020-01-19 LAB — COMPREHENSIVE METABOLIC PANEL
ALT: 30 U/L (ref 0–53)
AST: 33 U/L (ref 0–37)
Albumin: 4.6 g/dL (ref 3.5–5.2)
Alkaline Phosphatase: 59 U/L (ref 39–117)
BUN: 17 mg/dL (ref 6–23)
CO2: 31 mEq/L (ref 19–32)
Calcium: 9.7 mg/dL (ref 8.4–10.5)
Chloride: 100 mEq/L (ref 96–112)
Creatinine, Ser: 0.89 mg/dL (ref 0.40–1.50)
GFR: 96.49 mL/min (ref >60.00)
Glucose, Bld: 95 mg/dL (ref 70–99)
Potassium: 4.8 mEq/L (ref 3.5–5.1)
Sodium: 138 mEq/L (ref 135–145)
Total Bilirubin: 0.9 mg/dL (ref 0.2–1.2)
Total Protein: 7.5 g/dL (ref 6.0–8.3)

## 2020-01-19 LAB — LIPID PANEL
Cholesterol: 197 mg/dL (ref 0–200)
HDL: 87.1 mg/dL (ref >39.00)
LDL Cholesterol: 91 mg/dL (ref 0–99)
NonHDL: 109.9
Total CHOL/HDL Ratio: 2
Triglycerides: 94 mg/dL (ref 0.0–149.0)
VLDL: 18.8 mg/dL (ref 0.0–40.0)

## 2020-01-19 LAB — CBC WITH DIFFERENTIAL/PLATELET
Basophils Absolute: 0 10*3/uL (ref 0.0–0.1)
Basophils Relative: 0.2 % (ref 0.0–3.0)
Eosinophils Absolute: 0.1 10*3/uL (ref 0.0–0.7)
Eosinophils Relative: 1.4 % (ref 0.0–5.0)
HCT: 41.7 % (ref 39.0–52.0)
Hemoglobin: 13.8 g/dL (ref 13.0–17.0)
Lymphocytes Relative: 20.9 % (ref 12.0–46.0)
Lymphs Abs: 1.5 10*3/uL (ref 0.7–4.0)
MCHC: 33 g/dL (ref 30.0–36.0)
MCV: 92.3 fl (ref 78.0–100.0)
Monocytes Absolute: 0.8 10*3/uL (ref 0.1–1.0)
Monocytes Relative: 11.4 % (ref 3.0–12.0)
Neutro Abs: 4.7 10*3/uL (ref 1.4–7.7)
Neutrophils Relative %: 66.1 % (ref 43.0–77.0)
Platelets: 259 10*3/uL (ref 150.0–400.0)
RBC: 4.51 Mil/uL (ref 4.22–5.81)
RDW: 13.1 % (ref 11.5–15.5)
WBC: 7.1 10*3/uL (ref 4.0–10.5)

## 2020-01-19 LAB — TSH: TSH: 4.46 u[IU]/mL (ref 0.35–4.50)

## 2020-01-19 LAB — PSA: PSA: 0.64 ng/mL (ref 0.10–4.00)

## 2020-01-19 MED ORDER — BETAMETHASONE DIPROPIONATE 0.05 % EX CREA: TOPICAL_CREAM | CUTANEOUS | 3 refills | Status: DC

## 2020-01-19 NOTE — Progress Notes (Signed)
Subjective:    Patient ID: Timothy Hardin, male    DOB: May 19, 1965, 54 y.o.   MRN: [id number]  HPI Patient presents for yearly preventative medicine examination. He is a pleasant 54 year old male who  has a past medical history of Anemia, Arthritis, Elevated liver function tests, Encounter for blood transfusion, adenomatous polyp of colon (03/18/2015), Hyperlipidemia, Plaque psoriasis, and Spinal stenosis in cervical region.  Hyperlipidemia - takes Lipitor 20 mg daily. He denies myalgia or fatigue  Lab Results  Component Value Date   CHOL 170 11/04/2018   HDL 73.40 11/04/2018   LDLCALC 166 (H) 10/05/2017   LDLDIRECT 57.0 11/04/2018   TRIG 351.0 (H) 11/04/2018   CHOLHDL 2 11/04/2018   Plaque psoriasis-currently using betamethasone cream which works well for him.   He has been to dermatology in the past and did not like how the medications made him feel  All immunizations and health maintenance protocols were reviewed with the patient and needed orders were placed.  Appropriate screening laboratory values were ordered for the patient including screening of hyperlipidemia, renal function and hepatic function. If indicated by BPH, a PSA was ordered.  Medication reconciliation,  past medical history, social history, problem list and allergies were reviewed in detail with the patient  Goals were established with regard to weight loss, exercise, and  diet in compliance with medications. He is working out 5 days a week and eating a heart healthy diet. He has also stopped drinking beer  Wt Readings from Last 3 Encounters:  01/19/20 161 lb 3.2 oz (73.1 kg)  11/04/18 170 lb 6.4 oz (77.3 kg)  06/22/18 170 lb (77.1 kg)    He is up-to-date on routine colon cancer screening  Review of Systems  Constitutional: Negative.   HENT: Negative.   Eyes: Negative.   Respiratory: Negative.   Cardiovascular: Negative.   Gastrointestinal: Negative.   Endocrine: Negative.   Genitourinary: Negative.    Musculoskeletal: Negative.   Skin: Positive for rash.  Allergic/Immunologic: Negative.   Neurological: Negative.   Hematological: Negative.   Psychiatric/Behavioral: Negative.   All other systems reviewed and are negative.    Past Medical History:  Diagnosis Date  . Anemia    dx 6 mos. old  . Arthritis   . Elevated liver function tests   . Encounter for blood transfusion    anemia as an infant  . Hx of adenomatous polyp of colon 03/18/2015  . Hyperlipidemia   . Plaque psoriasis   . Spinal stenosis in cervical region     Social History   Socioeconomic History  . Marital status: Single    Spouse name: Not on file  . Number of children: Not on file  . Years of education: Not on file  . Highest education level: Not on file  Occupational History  . Not on file  Tobacco Use  . Smoking status: Never Smoker  . Smokeless tobacco: Never Used  Substance and Sexual Activity  . Alcohol use: Yes    Alcohol/week: 24.0 standard drinks    Types: 24 Cans of beer per week    Comment: 6 daily  . Drug use: Yes    Frequency: 7.0 times per week    Types: Marijuana    Comment: daily  . Sexual activity: Not on file  Other Topics Concern  . Not on file  Social History Narrative   Divorced, 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  Social Determinants of Health   Financial Resource Strain: Not on file  Food Insecurity: Not on file  Transportation Needs: Not on file  Physical Activity: Not on file  Stress: Not on file  Social Connections: Not on file  Intimate Partner Violence: Not on file    Past Surgical History:  Procedure Laterality Date  . ANTERIOR CERVICAL DECOMP/DISCECTOMY FUSION  11/23/2011   Procedure: ANTERIOR CERVICAL DECOMPRESSION/DISCECTOMY FUSION 2 LEVELS;  Surgeon: Ophelia Charter, MD;  Location: [facility];  Service: Neurosurgery;  Laterality: N/A;  Cervical four-five Cervical five-six anterior cervical decompression with fusion  interbody prothesis plating and bonegraft  . MANDIBLE SURGERY  25 years ago   reduction  . TONSILLECTOMY      Family History  Adopted: Yes  Problem Relation Age of Onset  . Colon cancer Neg Hx     Allergies  Allergen Reactions  . Meperidine Hcl     REACTION: hives  . Hydrocodone-Acetaminophen Nausea Only    Intolerance    Current Outpatient Medications on File Prior to Visit  Medication Sig Dispense Refill  . atorvastatin (LIPITOR) 20 MG tablet TAKE 1 TABLET BY MOUTH EVERY DAY 60 tablet 0  . betamethasone dipropionate 0.05 % cream APPLY TO AFFECTED AREA TWICE A DAY 45 g 3  . calcipotriene-betamethasone (TACLONEX) ointment Apply topically daily. 100 g 3  . dicyclomine (BENTYL) 20 MG tablet Take 1 tablet (20 mg total) by mouth 2 (two) times daily. 20 tablet 0  . ondansetron (ZOFRAN ODT) 8 MG disintegrating tablet Take 1 tablet (8 mg total) by mouth every 8 (eight) hours as needed for nausea or vomiting. 20 tablet 0  . promethazine (PHENERGAN) 25 MG suppository Place 1 suppository (25 mg total) rectally every 6 (six) hours as needed for nausea or vomiting. 12 each 0  . famotidine (PEPCID) 20 MG tablet Take 1 tablet (20 mg total) by mouth 2 (two) times daily for 5 days. 10 tablet 0  . pantoprazole (PROTONIX) 20 MG tablet Take 1 tablet (20 mg total) by mouth daily. 30 tablet 1   No current facility-administered medications on file prior to visit.    BP 120/80 (BP Location: Left Arm, Patient Position: Sitting, Cuff Size: Large)   Pulse 69   Temp 98.5 F (36.9 C) (Oral)   Ht 5\' 9"  (1.753 m)   Wt 161 lb 3.2 oz (73.1 kg)   SpO2 96%   BMI 23.81 kg/m       Objective:   Physical Exam Vitals and nursing note reviewed.  Constitutional:      General: He is not in acute distress.    Appearance: Normal appearance. He is well-developed and normal weight.  HENT:     Head: Normocephalic and atraumatic.     Right Ear: Tympanic membrane, ear canal and external ear normal. There is no  impacted cerumen.     Left Ear: Tympanic membrane, ear canal and external ear normal. There is no impacted cerumen.     Nose: Nose normal. No congestion or rhinorrhea.     Mouth/Throat:     Mouth: Mucous membranes are moist.     Pharynx: Oropharynx is clear. No oropharyngeal exudate or posterior oropharyngeal erythema.  Eyes:     General:        Right eye: No discharge.        Left eye: No discharge.     Extraocular Movements: Extraocular movements intact.     Conjunctiva/sclera: Conjunctivae normal.     Pupils: Pupils  are equal, round, and reactive to light.  Neck:     Vascular: No carotid bruit.     Trachea: No tracheal deviation.  Cardiovascular:     Rate and Rhythm: Normal rate and regular rhythm.     Pulses: Normal pulses.     Heart sounds: Normal heart sounds. No murmur heard. No friction rub. No gallop.   Pulmonary:     Effort: Pulmonary effort is normal. No respiratory distress.     Breath sounds: Normal breath sounds. No stridor. No wheezing, rhonchi or rales.  Chest:     Chest wall: No tenderness.  Abdominal:     General: Bowel sounds are normal. There is no distension.     Palpations: Abdomen is soft. There is no mass.     Tenderness: There is no abdominal tenderness. There is no right CVA tenderness, left CVA tenderness, guarding or rebound.     Hernia: No hernia is present.  Musculoskeletal:        General: No swelling, tenderness, deformity or signs of injury. Normal range of motion.     Right lower leg: No edema.     Left lower leg: No edema.  Lymphadenopathy:     Cervical: No cervical adenopathy.  Skin:    General: Skin is warm and dry.     Capillary Refill: Capillary refill takes less than 2 seconds.     Coloration: Skin is not jaundiced or pale.     Findings: No bruising, erythema, lesion or rash.  Neurological:     General: No focal deficit present.     Mental Status: He is alert and oriented to person, place, and time.     Cranial Nerves: No cranial  nerve deficit.     Sensory: No sensory deficit.     Motor: No weakness.     Coordination: Coordination normal.     Gait: Gait normal.     Deep Tendon Reflexes: Reflexes normal.  Psychiatric:        Mood and Affect: Mood normal.        Behavior: Behavior normal.        Thought Content: Thought content normal.        Judgment: Judgment normal.       Assessment & Plan:  1. Routine general medical examination at a health care facility - Continue to exercise and eat healthy  - Follow up in one year or sooner if needed - CBC with Differential/Platelet; Future - TSH; Future - Lipid panel; Future  2. Hyperlipidemia, unspecified hyperlipidemia type - Consider increase in statin  - CBC with Differential/Platelet; Future - TSH; Future - Lipid panel; Future  3. PSORIASIS  - betamethasone dipropionate 0.05 % cream; APPLY TO AFFECTED AREA TWICE A DAY  Dispense: 45 g; Refill: 3  4. Prostate cancer screening  - PSA; Future  5. Need for tetanus booster  - Tdap vaccine greater than or equal to 7yo IM  Dorothyann Peng, NP

## 2020-01-19 NOTE — Patient Instructions (Signed)
It was great seeing you today   We will follow up with you regarding your blood work   Please let me know if you need anything    

## 2020-01-24 ENCOUNTER — Other Ambulatory Visit: Payer: Self-pay | Admitting: Adult Health

## 2020-07-11 ENCOUNTER — Encounter: Payer: Self-pay | Admitting: Internal Medicine

## 2020-10-02 ENCOUNTER — Other Ambulatory Visit (HOSPITAL_COMMUNITY)
Admission: RE | Admit: 2020-10-02 | Discharge: 2020-10-02 | Disposition: A | Discharge status: Home / Self Care | Payer: BC Managed Care – PPO | Source: Ambulatory Visit | Attending: Adult Health | Admitting: Adult Health

## 2020-10-02 ENCOUNTER — Other Ambulatory Visit: Payer: Self-pay

## 2020-10-02 ENCOUNTER — Encounter: Payer: Self-pay | Admitting: Adult Health

## 2020-10-02 ENCOUNTER — Ambulatory Visit (INDEPENDENT_AMBULATORY_CARE_PROVIDER_SITE_OTHER): Payer: BC Managed Care – PPO | Admitting: Adult Health

## 2020-10-02 VITALS — BP 120/80 | HR 75 | Temp 98.1°F | Ht 69.0 in | Wt 161.0 lb

## 2020-10-02 DIAGNOSIS — Z113 Encounter for screening for infections with a predominantly sexual mode of transmission: Secondary | ICD-10-CM

## 2020-10-02 NOTE — Progress Notes (Signed)
Subjective:    Patient ID: Timothy Hardin, male    DOB: [birth year], 55 y.o.   MRN: [id number]  HPI  55 year old male who is being evaluated today for concern of an STD/urinary tract infection.  He reports that 3 months ago he had a sexual encounter with an ex-girlfriend where he had unprotected sex.  He has not had any sexual contact since.  He reports "a small sensation at the tip of my penis from time to time."  Denies drainage, burning, itching, blisters, or any abnormality.  His symptom is very infrequently.  Has been getting lemon juice and cranberry juice.  He feels as though his symptoms have resolved but he just wants to make sure that he does not have anything that could cause issues down the road  Review of Systems See HPI   Past Medical History:  Diagnosis Date   Anemia    dx 6 mos. old   Arthritis    Elevated liver function tests    Encounter for blood transfusion    anemia as an infant   Hx of adenomatous polyp of colon 03/18/2015   Hyperlipidemia    Plaque psoriasis    Spinal stenosis in cervical region     Social History   Socioeconomic History   Marital status: Single    Spouse name: Not on file   Number of children: Not on file   Years of education: Not on file   Highest education level: Not on file  Occupational History   Not on file  Tobacco Use   Smoking status: Never   Smokeless tobacco: Never  Substance and Sexual Activity   Alcohol use: Yes    Alcohol/week: 24.0 standard drinks    Types: 24 Cans of beer per week    Comment: 6 daily   Drug use: Yes    Frequency: 7.0 times per week    Types: Marijuana    Comment: daily   Sexual activity: Not on file  Other Topics Concern   Not on file  Social History Narrative   Divorced, 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               Social Determinants of Health   Financial Resource Strain: Not on file  Food Insecurity: Not on file  Transportation Needs: Not on file  Physical  Activity: Not on file  Stress: Not on file  Social Connections: Not on file  Intimate Partner Violence: Not on file    Past Surgical History:  Procedure Laterality Date   ANTERIOR CERVICAL DECOMP/DISCECTOMY FUSION  11/23/2011   Procedure: ANTERIOR CERVICAL DECOMPRESSION/DISCECTOMY FUSION 2 LEVELS;  Surgeon: Ophelia Charter, MD;  Location: [facility];  Service: Neurosurgery;  Laterality: N/A;  Cervical four-five Cervical five-six anterior cervical decompression with fusion interbody prothesis plating and bonegraft   MANDIBLE SURGERY  25 years ago   reduction   TONSILLECTOMY      Family History  Adopted: Yes  Problem Relation Age of Onset   Colon cancer Neg Hx     Allergies  Allergen Reactions   Meperidine Hcl     REACTION: hives   Hydrocodone-Acetaminophen Nausea Only    Intolerance    Current Outpatient Medications on File Prior to Visit  Medication Sig Dispense Refill   atorvastatin (LIPITOR) 20 MG tablet TAKE 1 TABLET BY MOUTH EVERY DAY 90 tablet 3   betamethasone dipropionate 0.05 % cream APPLY TO AFFECTED AREA TWICE A DAY  45 g 3   calcipotriene-betamethasone (TACLONEX) ointment Apply topically daily. 100 g 3   No current facility-administered medications on file prior to visit.    BP 120/80   Pulse 75   Temp 98.1 F (36.7 C) (Oral)   Ht '5\' 9"'$  (1.753 m)   Wt 161 lb (73 kg)   SpO2 97%   BMI 23.78 kg/m       Objective:   Physical Exam Vitals and nursing note reviewed.  Constitutional:      Appearance: Normal appearance.  Abdominal:     General: Abdomen is flat. Bowel sounds are normal. There is no distension.     Palpations: Abdomen is soft. There is no mass.     Tenderness: There is no abdominal tenderness. There is no right CVA tenderness or left CVA tenderness.  Skin:    General: Skin is warm and dry.     Capillary Refill: Capillary refill takes less than 2 seconds.  Neurological:     General: No focal deficit present.     Mental Status: He is  alert and oriented to person, place, and time.  Psychiatric:        Mood and Affect: Mood normal.        Behavior: Behavior normal.        Thought Content: Thought content normal.        Judgment: Judgment normal.      Assessment & Plan:  1. Screening examination for STD (sexually transmitted disease) - Encouraged safe sex practiced  - Urinalysis; Future - Urine cytology ancillary only; Future  Dorothyann Peng, NP

## 2020-10-03 LAB — URINALYSIS
Bilirubin Urine: NEGATIVE
Hgb urine dipstick: NEGATIVE
Ketones, ur: NEGATIVE
Leukocytes,Ua: NEGATIVE
Nitrite: NEGATIVE
Specific Gravity, Urine: 1.025 (ref 1.000–1.030)
Total Protein, Urine: NEGATIVE
Urine Glucose: NEGATIVE
Urobilinogen, UA: 0.2 (ref 0.0–1.0)
pH: 6 (ref 5.0–8.0)

## 2020-10-04 LAB — URINE CYTOLOGY ANCILLARY ONLY
Chlamydia: NEGATIVE
Comment: NEGATIVE
Comment: NEGATIVE
Comment: NORMAL
Neisseria Gonorrhea: NEGATIVE
Trichomonas: NEGATIVE

## 2020-10-08 ENCOUNTER — Ambulatory Visit: Payer: BC Managed Care – PPO | Admitting: Adult Health

## 2021-01-22 ENCOUNTER — Ambulatory Visit (INDEPENDENT_AMBULATORY_CARE_PROVIDER_SITE_OTHER): Payer: BC Managed Care – PPO | Admitting: Adult Health

## 2021-01-22 ENCOUNTER — Encounter: Payer: Self-pay | Admitting: Adult Health

## 2021-01-22 VITALS — BP 120/68 | HR 64 | Temp 97.8°F | Ht 69.0 in | Wt 164.0 lb

## 2021-01-22 DIAGNOSIS — Z0000 Encounter for general adult medical examination without abnormal findings: Secondary | ICD-10-CM | POA: Diagnosis not present

## 2021-01-22 DIAGNOSIS — E785 Hyperlipidemia, unspecified: Secondary | ICD-10-CM

## 2021-01-22 DIAGNOSIS — Z125 Encounter for screening for malignant neoplasm of prostate: Secondary | ICD-10-CM | POA: Diagnosis not present

## 2021-01-22 DIAGNOSIS — Z23 Encounter for immunization: Secondary | ICD-10-CM | POA: Diagnosis not present

## 2021-01-22 DIAGNOSIS — L408 Other psoriasis: Secondary | ICD-10-CM | POA: Diagnosis not present

## 2021-01-22 LAB — CBC WITH DIFFERENTIAL/PLATELET
Basophils Absolute: 0 10*3/uL (ref 0.0–0.1)
Basophils Relative: 0.1 % (ref 0.0–3.0)
Eosinophils Absolute: 0.1 10*3/uL (ref 0.0–0.7)
Eosinophils Relative: 1.1 % (ref 0.0–5.0)
HCT: 42 % (ref 39.0–52.0)
Hemoglobin: 13.9 g/dL (ref 13.0–17.0)
Lymphocytes Relative: 25.6 % (ref 12.0–46.0)
Lymphs Abs: 1.4 10*3/uL (ref 0.7–4.0)
MCHC: 33 g/dL (ref 30.0–36.0)
MCV: 93.9 fl (ref 78.0–100.0)
Monocytes Absolute: 0.6 10*3/uL (ref 0.1–1.0)
Monocytes Relative: 11.3 % (ref 3.0–12.0)
Neutro Abs: 3.4 10*3/uL (ref 1.4–7.7)
Neutrophils Relative %: 61.9 % (ref 43.0–77.0)
Platelets: 199 10*3/uL (ref 150.0–400.0)
RBC: 4.48 Mil/uL (ref 4.22–5.81)
RDW: 13.4 % (ref 11.5–15.5)
WBC: 5.5 10*3/uL (ref 4.0–10.5)

## 2021-01-22 LAB — COMPREHENSIVE METABOLIC PANEL
ALT: 41 U/L (ref 0–53)
AST: 36 U/L (ref 0–37)
Albumin: 4.4 g/dL (ref 3.5–5.2)
Alkaline Phosphatase: 44 U/L (ref 39–117)
BUN: 16 mg/dL (ref 6–23)
CO2: 30 mEq/L (ref 19–32)
Calcium: 9.4 mg/dL (ref 8.4–10.5)
Chloride: 101 mEq/L (ref 96–112)
Creatinine, Ser: 0.78 mg/dL (ref 0.40–1.50)
GFR: 99.7 mL/min (ref >60.00)
Glucose, Bld: 97 mg/dL (ref 70–99)
Potassium: 4.5 mEq/L (ref 3.5–5.1)
Sodium: 137 mEq/L (ref 135–145)
Total Bilirubin: 0.9 mg/dL (ref 0.2–1.2)
Total Protein: 7.1 g/dL (ref 6.0–8.3)

## 2021-01-22 LAB — LIPID PANEL
Cholesterol: 174 mg/dL (ref 0–200)
HDL: 86.9 mg/dL (ref >39.00)
LDL Cholesterol: 70 mg/dL (ref 0–99)
NonHDL: 87.39
Total CHOL/HDL Ratio: 2
Triglycerides: 89 mg/dL (ref 0.0–149.0)
VLDL: 17.8 mg/dL (ref 0.0–40.0)

## 2021-01-22 LAB — TSH: TSH: 1.95 u[IU]/mL (ref 0.35–5.50)

## 2021-01-22 LAB — PSA: PSA: 0.57 ng/mL (ref 0.10–4.00)

## 2021-01-22 MED ORDER — ATORVASTATIN CALCIUM 20 MG PO TABS: 20.0000 mg | ORAL_TABLET | Freq: Every day | ORAL | 3 refills | Status: DC

## 2021-01-22 NOTE — Patient Instructions (Signed)
It was great seeing you today   We will follow up with you regarding your lab work   Please let me know if you need anything   

## 2021-01-22 NOTE — Progress Notes (Signed)
Subjective:    Patient ID: Timothy Hardin, male    DOB: August 29, 1965, 55 y.o.   MRN: [id number]  HPI Patient presents for yearly preventative medicine examination. He is a pleasant 55 year old male who  has a past medical history of Anemia, Arthritis, Elevated liver function tests, Encounter for blood transfusion, adenomatous polyp of colon (03/18/2015), Hyperlipidemia, Plaque psoriasis, and Spinal stenosis in cervical region.  Hyperlipidemia -current treatment includes Lipitor 20 mg daily.  He denies myalgia or fatigue.  Lab Results  Component Value Date   CHOL 197 01/19/2020   HDL 87.10 01/19/2020   LDLCALC 91 01/19/2020   LDLDIRECT 57.0 11/04/2018   TRIG 94.0 01/19/2020   CHOLHDL 2 01/19/2020    Plaque psoriasis- not using anything currently. His rash is under control.   All immunizations and health maintenance protocols were reviewed with the patient and needed orders were placed.  Appropriate screening laboratory values were ordered for the patient including screening of hyperlipidemia, renal function and hepatic function. If indicated by BPH, a PSA was ordered.  Medication reconciliation,  past medical history, social history, problem list and allergies were reviewed in detail with the patient  Goals were established with regard to weight loss, exercise, and  diet in compliance with medications.  Continues to workout approximately 5 times a week and eats a heart healthy diet. Wt Readings from Last 3 Encounters:  01/22/21 164 lb (74.4 kg)  10/02/20 161 lb (73 kg)  01/19/20 161 lb 3.2 oz (73.1 kg)   He is up-to-date on routine colon cancer screening  He has no acute complaints today.   Review of Systems  Constitutional: Negative.   HENT: Negative.    Eyes: Negative.   Respiratory: Negative.    Cardiovascular: Negative.   Gastrointestinal: Negative.   Endocrine: Negative.   Genitourinary: Negative.   Musculoskeletal: Negative.   Skin: Negative.    Allergic/Immunologic: Negative.   Neurological: Negative.   Hematological: Negative.   Psychiatric/Behavioral: Negative.    All other systems reviewed and are negative.  Past Medical History:  Diagnosis Date   Anemia    dx 6 mos. old   Arthritis    Elevated liver function tests    Encounter for blood transfusion    anemia as an infant   Hx of adenomatous polyp of colon 03/18/2015   Hyperlipidemia    Plaque psoriasis    Spinal stenosis in cervical region     Social History   Socioeconomic History   Marital status: Single    Spouse name: Not on file   Number of children: Not on file   Years of education: Not on file   Highest education level: Not on file  Occupational History   Not on file  Tobacco Use   Smoking status: Never   Smokeless tobacco: Never  Substance and Sexual Activity   Alcohol use: Yes    Alcohol/week: 24.0 standard drinks    Types: 24 Cans of beer per week    Comment: 6 daily   Drug use: Yes    Frequency: 7.0 times per week    Types: Marijuana    Comment: daily   Sexual activity: Not on file  Other Topics Concern   Not on file  Social History Narrative   Divorced, 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               Social Determinants of Health   Financial Resource Strain: Not on file  Food Insecurity: Not on file  Transportation Needs: Not on file  Physical Activity: Not on file  Stress: Not on file  Social Connections: Not on file  Intimate Partner Violence: Not on file    Past Surgical History:  Procedure Laterality Date   ANTERIOR CERVICAL DECOMP/DISCECTOMY FUSION  11/23/2011   Procedure: ANTERIOR CERVICAL DECOMPRESSION/DISCECTOMY FUSION 2 LEVELS;  Surgeon: Ophelia Charter, MD;  Location: [facility];  Service: Neurosurgery;  Laterality: N/A;  Cervical four-five Cervical five-six anterior cervical decompression with fusion interbody prothesis plating and bonegraft   MANDIBLE SURGERY  25 years ago   reduction    TONSILLECTOMY      Family History  Adopted: Yes  Problem Relation Age of Onset   Colon cancer Neg Hx     Allergies  Allergen Reactions   Meperidine Hcl     REACTION: hives   Hydrocodone-Acetaminophen Nausea Only    Intolerance    Current Outpatient Medications on File Prior to Visit  Medication Sig Dispense Refill   atorvastatin (LIPITOR) 20 MG tablet TAKE 1 TABLET BY MOUTH EVERY DAY 90 tablet 3   betamethasone dipropionate 0.05 % cream APPLY TO AFFECTED AREA TWICE A DAY 45 g 3   calcipotriene-betamethasone (TACLONEX) ointment Apply topically daily. 100 g 3   No current facility-administered medications on file prior to visit.    BP 120/68    Pulse 64    Temp 97.8 F (36.6 C) (Oral)    Ht 5\' 9"  (1.753 m)    Wt 164 lb (74.4 kg)    SpO2 98%    BMI 24.22 kg/m        Objective:   Physical Exam Vitals and nursing note reviewed.  Constitutional:      General: He is not in acute distress.    Appearance: Normal appearance. He is well-developed and normal weight.  HENT:     Head: Normocephalic and atraumatic.     Right Ear: Tympanic membrane, ear canal and external ear normal. There is no impacted cerumen.     Left Ear: Tympanic membrane, ear canal and external ear normal. There is no impacted cerumen.     Nose: Nose normal. No congestion or rhinorrhea.     Mouth/Throat:     Mouth: Mucous membranes are moist.     Pharynx: Oropharynx is clear. No oropharyngeal exudate or posterior oropharyngeal erythema.  Eyes:     General:        Right eye: No discharge.        Left eye: No discharge.     Extraocular Movements: Extraocular movements intact.     Conjunctiva/sclera: Conjunctivae normal.     Pupils: Pupils are equal, round, and reactive to light.  Neck:     Vascular: No carotid bruit.     Trachea: No tracheal deviation.  Cardiovascular:     Rate and Rhythm: Normal rate and regular rhythm.     Pulses: Normal pulses.     Heart sounds: Normal heart sounds. No murmur  heard.   No friction rub. No gallop.  Pulmonary:     Effort: Pulmonary effort is normal. No respiratory distress.     Breath sounds: Normal breath sounds. No stridor. No wheezing, rhonchi or rales.  Chest:     Chest wall: No tenderness.  Abdominal:     General: Bowel sounds are normal. There is no distension.     Palpations: Abdomen is soft. There is no mass.     Tenderness: There is no abdominal  tenderness. There is no right CVA tenderness, left CVA tenderness, guarding or rebound.     Hernia: No hernia is present.  Musculoskeletal:        General: No swelling, tenderness, deformity or signs of injury. Normal range of motion.     Right lower leg: No edema.     Left lower leg: No edema.  Lymphadenopathy:     Cervical: No cervical adenopathy.  Skin:    General: Skin is warm and dry.     Capillary Refill: Capillary refill takes less than 2 seconds.     Coloration: Skin is not jaundiced or pale.     Findings: No bruising, erythema, lesion or rash.     Comments: Very minor psoriasis rash on bilateral legs   Neurological:     General: No focal deficit present.     Mental Status: He is alert and oriented to person, place, and time.     Cranial Nerves: No cranial nerve deficit.     Sensory: No sensory deficit.     Motor: No weakness.     Coordination: Coordination normal.     Gait: Gait normal.     Deep Tendon Reflexes: Reflexes normal.  Psychiatric:        Mood and Affect: Mood normal.        Behavior: Behavior normal.        Thought Content: Thought content normal.        Judgment: Judgment normal.       Assessment & Plan:  1. Routine general medical examination at a health care facility - Follow up in one year or sooner if needed - Continue to exercise and eat healthy   - CBC with Differential/Platelet; Future - Comprehensive metabolic panel; Future - Lipid panel; Future - TSH; Future  2. Hyperlipidemia, unspecified hyperlipidemia type - Well controlled.  - CBC with  Differential/Platelet; Future - Comprehensive metabolic panel; Future - Lipid panel; Future - TSH; Future - atorvastatin (LIPITOR) 20 MG tablet; Take 1 tablet (20 mg total) by mouth daily.  Dispense: 90 tablet; Refill: 3  3. Prostate cancer screening  - PSA; Future  4. PSORIASIS   5. Need for shingles vaccine  - Varicella-zoster vaccine IM  6. Need for immunization against influenza  - Flu Vaccine QUAD 42mo+IM (Fluarix, Fluzone & Alfiuria Quad PF)  Dorothyann Peng, NP

## 2021-07-08 ENCOUNTER — Encounter: Payer: Self-pay | Admitting: Adult Health

## 2021-07-08 ENCOUNTER — Ambulatory Visit: Payer: BC Managed Care – PPO | Admitting: Adult Health

## 2021-07-08 ENCOUNTER — Ambulatory Visit (INDEPENDENT_AMBULATORY_CARE_PROVIDER_SITE_OTHER): Payer: BC Managed Care – PPO

## 2021-07-08 VITALS — BP 130/80 | HR 90 | Temp 98.6°F | Ht 69.0 in | Wt 159.0 lb

## 2021-07-08 DIAGNOSIS — M79675 Pain in left toe(s): Secondary | ICD-10-CM | POA: Diagnosis not present

## 2021-07-08 DIAGNOSIS — M159 Polyosteoarthritis, unspecified: Secondary | ICD-10-CM

## 2021-07-08 MED ORDER — MELOXICAM 15 MG PO TABS: 15.0000 mg | ORAL_TABLET | Freq: Every day | ORAL | 1 refills | Status: AC

## 2021-07-08 NOTE — Progress Notes (Signed)
Subjective:    Patient ID: Timothy Hardin, male    DOB: 28-Jan-1966, 56 y.o.   MRN: [id number]  HPI 56 year old male who  has a past medical history of Anemia, Arthritis, Elevated liver function tests, Encounter for blood transfusion, adenomatous polyp of colon (03/18/2015), Hyperlipidemia, Plaque psoriasis, and Spinal stenosis in cervical region.  He presents to the office today for concern of a "bunion" on his left great toe. He reports that he has had most frequent with standing and weight bearing. He takes tylenol and motrin on a daily basis due to osteoarthritis of multiple joints ( mainly in back, knees, and fingers). He is also wondering if there is something else he can take for arthritis so that he is not popping to many pills a day   Review of Systems See HPI   Past Medical History:  Diagnosis Date   Anemia    dx 6 mos. old   Arthritis    Elevated liver function tests    Encounter for blood transfusion    anemia as an infant   Hx of adenomatous polyp of colon 03/18/2015   Hyperlipidemia    Plaque psoriasis    Spinal stenosis in cervical region     Social History   Socioeconomic History   Marital status: Single    Spouse name: Not on file   Number of children: Not on file   Years of education: Not on file   Highest education level: Not on file  Occupational History   Not on file  Tobacco Use   Smoking status: Never   Smokeless tobacco: Never  Substance and Sexual Activity   Alcohol use: Yes    Alcohol/week: 24.0 standard drinks    Types: 24 Cans of beer per week    Comment: 6 daily   Drug use: Yes    Frequency: 7.0 times per week    Types: Marijuana    Comment: daily   Sexual activity: Not on file  Other Topics Concern   Not on file  Social History Narrative   Divorced, 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   [place]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               Social Determinants of Health   Financial Resource Strain: Not on file  Food Insecurity: Not on file   Transportation Needs: Not on file  Physical Activity: Not on file  Stress: Not on file  Social Connections: Not on file  Intimate Partner Violence: Not on file    Past Surgical History:  Procedure Laterality Date   ANTERIOR CERVICAL DECOMP/DISCECTOMY FUSION  11/23/2011   Procedure: ANTERIOR CERVICAL DECOMPRESSION/DISCECTOMY FUSION 2 LEVELS;  Surgeon: Ophelia Charter, MD;  Location: [place] [facility];  Service: Neurosurgery;  Laterality: N/A;  Cervical four-five Cervical five-six anterior cervical decompression with fusion interbody prothesis plating and bonegraft   MANDIBLE SURGERY  25 years ago   reduction   TONSILLECTOMY      Family History  Adopted: Yes  Problem Relation Age of Onset   Colon cancer Neg Hx     Allergies  Allergen Reactions   Meperidine Hcl     REACTION: hives   Hydrocodone-Acetaminophen Nausea Only    Intolerance    Current Outpatient Medications on File Prior to Visit  Medication Sig Dispense Refill   atorvastatin (LIPITOR) 20 MG tablet Take 1 tablet (20 mg total) by mouth daily. 90 tablet 3   betamethasone dipropionate 0.05 % cream APPLY TO AFFECTED AREA  TWICE A DAY 45 g 3   calcipotriene-betamethasone (TACLONEX) ointment Apply topically daily. 100 g 3   No current facility-administered medications on file prior to visit.    BP 130/80   Pulse 90   Temp 98.6 F (37 C) (Oral)   Ht '5\' 9"'$  (1.753 m)   Wt 159 lb (72.1 kg)   SpO2 96%   BMI 23.48 kg/m       Objective:   Physical Exam Vitals and nursing note reviewed.  Constitutional:      Appearance: Normal appearance.  Cardiovascular:     Rate and Rhythm: Normal rate and regular rhythm.     Pulses: Normal pulses.     Heart sounds: Normal heart sounds.  Pulmonary:     Effort: Pulmonary effort is normal.     Breath sounds: Normal breath sounds.  Musculoskeletal:        General: Normal range of motion.       Feet:  Skin:    General: Skin is warm and dry.     Capillary Refill: Capillary  refill takes less than 2 seconds.  Neurological:     Mental Status: He is alert.      Assessment & Plan:  1. Primary osteoarthritis involving multiple joints - Will prescribe mobic - advised against taking any other NSAIDS at this time  - meloxicam (MOBIC) 15 MG tablet; Take 1 tablet (15 mg total) by mouth daily.  Dispense: 90 tablet; Refill: 1  2. Great toe pain, left  - DG Foot Complete Left; Future - Consider referral to podiatry    Dorothyann Peng, NP

## 2021-07-10 ENCOUNTER — Telehealth: Payer: Self-pay | Admitting: Adult Health

## 2021-07-10 NOTE — Telephone Encounter (Signed)
Updated patient on his xray which showed  IMPRESSION: Severe great toe metatarsophalangeal joint osteoarthritis.  He is not having any pain - will defer ortho eval at this time

## 2021-07-19 ENCOUNTER — Encounter (HOSPITAL_BASED_OUTPATIENT_CLINIC_OR_DEPARTMENT_OTHER): Payer: Self-pay

## 2021-07-19 ENCOUNTER — Emergency Department (HOSPITAL_BASED_OUTPATIENT_CLINIC_OR_DEPARTMENT_OTHER)
Admission: EM | Admit: 2021-07-19 | Discharge: 2021-07-19 | Disposition: A | Discharge status: Home / Self Care | Payer: BC Managed Care – PPO | Attending: Emergency Medicine | Admitting: Emergency Medicine

## 2021-07-19 ENCOUNTER — Other Ambulatory Visit: Payer: Self-pay

## 2021-07-19 DIAGNOSIS — E860 Dehydration: Secondary | ICD-10-CM | POA: Insufficient documentation

## 2021-07-19 DIAGNOSIS — R197 Diarrhea, unspecified: Secondary | ICD-10-CM | POA: Insufficient documentation

## 2021-07-19 LAB — COMPREHENSIVE METABOLIC PANEL
ALT: 38 U/L (ref 0–44)
AST: 27 U/L (ref 15–41)
Albumin: 5.1 g/dL — ABNORMAL HIGH (ref 3.5–5.0)
Alkaline Phosphatase: 56 U/L (ref 38–126)
Anion gap: 18 — ABNORMAL HIGH (ref 5–15)
BUN: 16 mg/dL (ref 6–20)
CO2: 18 mmol/L — ABNORMAL LOW (ref 22–32)
Calcium: 10.7 mg/dL — ABNORMAL HIGH (ref 8.9–10.3)
Chloride: 100 mmol/L (ref 98–111)
Creatinine, Ser: 1.01 mg/dL (ref 0.61–1.24)
GFR, Estimated: >60 mL/min (ref >60)
Glucose, Bld: 151 mg/dL — ABNORMAL HIGH (ref 70–99)
Potassium: 3.6 mmol/L (ref 3.5–5.1)
Sodium: 136 mmol/L (ref 135–145)
Total Bilirubin: 2 mg/dL — ABNORMAL HIGH (ref 0.3–1.2)
Total Protein: 8.6 g/dL — ABNORMAL HIGH (ref 6.5–8.1)

## 2021-07-19 LAB — CBC WITH DIFFERENTIAL/PLATELET
Abs Immature Granulocytes: 0.05 10*3/uL (ref 0.00–0.07)
Basophils Absolute: 0 10*3/uL (ref 0.0–0.1)
Basophils Relative: 0 %
Eosinophils Absolute: 0 10*3/uL (ref 0.0–0.5)
Eosinophils Relative: 0 %
HCT: 51.2 % (ref 39.0–52.0)
Hemoglobin: 17.5 g/dL — ABNORMAL HIGH (ref 13.0–17.0)
Immature Granulocytes: 0 %
Lymphocytes Relative: 11 %
Lymphs Abs: 1.6 10*3/uL (ref 0.7–4.0)
MCH: 30.9 pg (ref 26.0–34.0)
MCHC: 34.2 g/dL (ref 30.0–36.0)
MCV: 90.3 fL (ref 80.0–100.0)
Monocytes Absolute: 0.9 10*3/uL (ref 0.1–1.0)
Monocytes Relative: 6 %
Neutro Abs: 12.4 10*3/uL — ABNORMAL HIGH (ref 1.7–7.7)
Neutrophils Relative %: 83 %
Platelets: 279 10*3/uL (ref 150–400)
RBC: 5.67 MIL/uL (ref 4.22–5.81)
RDW: 12.9 % (ref 11.5–15.5)
WBC: 15 10*3/uL — ABNORMAL HIGH (ref 4.0–10.5)
nRBC: 0 % (ref 0.0–0.2)

## 2021-07-19 LAB — URINALYSIS, ROUTINE W REFLEX MICROSCOPIC
Bilirubin Urine: NEGATIVE
Glucose, UA: NEGATIVE mg/dL
Hgb urine dipstick: NEGATIVE
Ketones, ur: >80 mg/dL — AB
Leukocytes,Ua: NEGATIVE
Nitrite: NEGATIVE
Protein, ur: 30 mg/dL — AB
Specific Gravity, Urine: 1.032 — ABNORMAL HIGH (ref 1.005–1.030)
pH: 6 (ref 5.0–8.0)

## 2021-07-19 LAB — TROPONIN I (HIGH SENSITIVITY): Troponin I (High Sensitivity): 3 ng/L (ref <18)

## 2021-07-19 MED ORDER — PANTOPRAZOLE SODIUM 40 MG IV SOLR
40.0000 mg | Freq: Once | INTRAVENOUS | Status: AC
  Administered 2021-07-19: 40 mg via INTRAVENOUS
  Filled 2021-07-19: qty 10

## 2021-07-19 MED ORDER — SODIUM CHLORIDE 0.9 % IV BOLUS
1000.0000 mL | Freq: Once | INTRAVENOUS | Status: AC
Start: 2021-07-19 — End: 2021-07-19
  Administered 2021-07-19: 1000 mL via INTRAVENOUS

## 2021-07-19 MED ORDER — METOCLOPRAMIDE HCL 5 MG/ML IJ SOLN
10.0000 mg | Freq: Once | INTRAMUSCULAR | Status: AC | PRN
  Administered 2021-07-19: 10 mg via INTRAVENOUS
  Filled 2021-07-19: qty 2

## 2021-07-19 MED ORDER — METOCLOPRAMIDE HCL 10 MG PO TABS: 10.0000 mg | ORAL_TABLET | Freq: Four times a day (QID) | ORAL | 0 refills | Status: DC | PRN

## 2021-07-19 MED ORDER — ONDANSETRON HCL 4 MG/2ML IJ SOLN
4.0000 mg | Freq: Once | INTRAMUSCULAR | Status: AC
  Administered 2021-07-19: 4 mg via INTRAVENOUS
  Filled 2021-07-19: qty 2

## 2021-07-19 MED ORDER — LOPERAMIDE HCL 2 MG PO CAPS: 4.0000 mg | ORAL_CAPSULE | Freq: Once | ORAL | Status: DC

## 2021-07-19 MED ORDER — SODIUM CHLORIDE 0.9 % IV BOLUS
1000.0000 mL | Freq: Once | INTRAVENOUS | Status: AC
  Administered 2021-07-19: 1000 mL via INTRAVENOUS

## 2021-07-19 NOTE — ED Provider Notes (Signed)
[facility] Provider Note: Georgena Spurling, MD, FACEP  CSN: [id number] MRN: [id number] ARRIVAL: 07/19/21 at [facility]: [place]  Diarrhea   HISTORY OF PRESENT ILLNESS  07/19/21 3:51 AM Timothy Hardin is a 56 y.o. male with severe watery diarrhea for about the past 2 days.  He has had nausea and retching but no frank vomiting.  He has had some mild abdominal cramping but no abdominal pain.  He feels generally weak.  He has not noted any blood in his diarrhea.   Past Medical History:  Diagnosis Date   Anemia    dx 6 mos. old   Arthritis    Elevated liver function tests    Encounter for blood transfusion    anemia as an infant   Hx of adenomatous polyp of colon 03/18/2015   Hyperlipidemia    Plaque psoriasis    Spinal stenosis in cervical region     Past Surgical History:  Procedure Laterality Date   ANTERIOR CERVICAL DECOMP/DISCECTOMY FUSION  11/23/2011   Procedure: ANTERIOR CERVICAL DECOMPRESSION/DISCECTOMY FUSION 2 LEVELS;  Surgeon: Ophelia Charter, MD;  Location: [place] [facility];  Service: Neurosurgery;  Laterality: N/A;  Cervical four-five Cervical five-six anterior cervical decompression with fusion interbody prothesis plating and bonegraft   MANDIBLE SURGERY  25 years ago   reduction   TONSILLECTOMY      Family History  Adopted: Yes  Problem Relation Age of Onset   Colon cancer Neg Hx     Social History   Tobacco Use   Smoking status: Never   Smokeless tobacco: Never  Substance Use Topics   Alcohol use: Yes    Alcohol/week: 24.0 standard drinks of alcohol    Types: 24 Cans of beer per week    Comment: 6 daily   Drug use: Yes    Frequency: 7.0 times per week    Types: Marijuana    Comment: daily    Prior to Admission medications   Medication Sig Start Date End Date Taking? Authorizing Provider  metoCLOPramide (REGLAN) 10 MG tablet Take 1 tablet (10 mg total) by mouth every 6 (six) hours as needed for nausea or vomiting.  07/19/21  Yes Dempsey Ahonen, MD  atorvastatin (LIPITOR) 20 MG tablet Take 1 tablet (20 mg total) by mouth daily. 01/22/21   Nafziger, Tommi Rumps, NP  betamethasone dipropionate 0.05 % cream APPLY TO AFFECTED AREA TWICE A DAY 01/19/20   Nafziger, Tommi Rumps, NP  calcipotriene-betamethasone (TACLONEX) ointment Apply topically daily. 03/21/19   Nafziger, Tommi Rumps, NP  meloxicam (MOBIC) 15 MG tablet Take 1 tablet (15 mg total) by mouth daily. 07/08/21 10/06/21  Nafziger, Tommi Rumps, NP    Allergies Meperidine hcl and Hydrocodone-acetaminophen   REVIEW OF SYSTEMS  Negative except as noted here or in the History of Present Illness.   PHYSICAL EXAMINATION  Initial Vital Signs Blood pressure (!) 147/97, pulse 77, temperature (!) 97.5 F (36.4 C), resp. rate (!) 24, height '5\' 9"'$  (1.753 m), weight 72.6 kg, SpO2 100 %.  Examination General: Well-developed, well-nourished male in no acute distress; appearance consistent with age of record HENT: normocephalic; atraumatic Eyes: pupils equal, round and reactive to light; extraocular muscles intact Neck: supple Heart: regular rate and rhythm Lungs: clear to auscultation bilaterally Abdomen: soft; nondistended; nontender; bowel sounds present Extremities: No deformity; full range of motion; pulses normal Neurologic: Awake, alert and oriented; motor function intact in all extremities and symmetric; no facial droop Skin: Warm and dry Psychiatric: Normal mood and affect  RESULTS  Summary of this visit's results, reviewed and interpreted by myself:   EKG Interpretation  Date/Time:  Saturday July 19 2021 03:09:16 EDT Ventricular Rate:  76 PR Interval:  154 QRS Duration: 87 QT Interval:  388 QTC Calculation: 437 R Axis:   68 Text Interpretation: Sinus rhythm Right atrial enlargement Minimal ST depression, inferior leads No previous ECGs available Confirmed by Shanon Rosser [phone]) on 07/19/2021 3:13:19 AM       Laboratory Studies: Results for orders placed or  performed during the hospital encounter of 07/19/21 (from the past 24 hour(s))  CBC with Differential     Status: Abnormal   Collection Time: 07/19/21  3:15 AM  Result Value Ref Range   WBC 15.0 (H) 4.0 - 10.5 K/uL   RBC 5.67 4.22 - 5.81 MIL/uL   Hemoglobin 17.5 (H) 13.0 - 17.0 g/dL   HCT 51.2 39.0 - 52.0 %   MCV 90.3 80.0 - 100.0 fL   MCH 30.9 26.0 - 34.0 pg   MCHC 34.2 30.0 - 36.0 g/dL   RDW 12.9 11.5 - 15.5 %   Platelets 279 150 - 400 K/uL   nRBC 0.0 0.0 - 0.2 %   Neutrophils Relative % 83 %   Neutro Abs 12.4 (H) 1.7 - 7.7 K/uL   Lymphocytes Relative 11 %   Lymphs Abs 1.6 0.7 - 4.0 K/uL   Monocytes Relative 6 %   Monocytes Absolute 0.9 0.1 - 1.0 K/uL   Eosinophils Relative 0 %   Eosinophils Absolute 0.0 0.0 - 0.5 K/uL   Basophils Relative 0 %   Basophils Absolute 0.0 0.0 - 0.1 K/uL   Immature Granulocytes 0 %   Abs Immature Granulocytes 0.05 0.00 - 0.07 K/uL  Troponin I (High Sensitivity)     Status: None   Collection Time: 07/19/21  3:15 AM  Result Value Ref Range   Troponin I (High Sensitivity) 3 <18 ng/L  Comprehensive metabolic panel     Status: Abnormal   Collection Time: 07/19/21  3:15 AM  Result Value Ref Range   Sodium 136 135 - 145 mmol/L   Potassium 3.6 3.5 - 5.1 mmol/L   Chloride 100 98 - 111 mmol/L   CO2 18 (L) 22 - 32 mmol/L   Glucose, Bld 151 (H) 70 - 99 mg/dL   BUN 16 6 - 20 mg/dL   Creatinine, Ser 1.01 0.61 - 1.24 mg/dL   Calcium 10.7 (H) 8.9 - 10.3 mg/dL   Total Protein 8.6 (H) 6.5 - 8.1 g/dL   Albumin 5.1 (H) 3.5 - 5.0 g/dL   AST 27 15 - 41 U/L   ALT 38 0 - 44 U/L   Alkaline Phosphatase 56 38 - 126 U/L   Total Bilirubin 2.0 (H) 0.3 - 1.2 mg/dL   GFR, Estimated >60 >60 mL/min   Anion gap 18 (H) 5 - 15  Urinalysis, Routine w reflex microscopic Urine, Clean Catch     Status: Abnormal   Collection Time: 07/19/21  4:50 AM  Result Value Ref Range   Color, Urine YELLOW YELLOW   APPearance CLEAR CLEAR   Specific Gravity, Urine 1.032 (H) 1.005 - 1.030    pH 6.0 5.0 - 8.0   Glucose, UA NEGATIVE NEGATIVE mg/dL   Hgb urine dipstick NEGATIVE NEGATIVE   Bilirubin Urine NEGATIVE NEGATIVE   Ketones, ur >80 (A) NEGATIVE mg/dL   Protein, ur 30 (A) NEGATIVE mg/dL   Nitrite NEGATIVE NEGATIVE   Leukocytes,Ua NEGATIVE NEGATIVE   RBC / HPF  0-5 0 - 5 RBC/hpf   WBC, UA 0-5 0 - 5 WBC/hpf   Bacteria, UA RARE (A) NONE SEEN   Squamous Epithelial / LPF 0-5 0 - 5   Mucus PRESENT    Imaging Studies: No results found.  ED COURSE and MDM  Nursing notes, initial and subsequent vitals signs, including pulse oximetry, reviewed and interpreted by myself.  Vitals:   07/19/21 0309 07/19/21 0406 07/19/21 0430  BP: (!) 147/97  136/68  Pulse: 77  69  Resp: (!) 24  (!) 22  Temp: (!) 97.5 F (36.4 C) 98.6 F (37 C)   TempSrc:  Oral   SpO2: 100%  99%  Weight: 72.6 kg    Height: '5\' 9"'$  (1.753 m)     Medications  loperamide (IMODIUM) capsule 4 mg (has no administration in time range)  sodium chloride 0.9 % bolus 1,000 mL (0 mLs Intravenous Stopped 07/19/21 0400)  ondansetron (ZOFRAN) injection 4 mg (4 mg Intravenous Given 07/19/21 0326)  sodium chloride 0.9 % bolus 1,000 mL (0 mLs Intravenous Stopped 07/19/21 0436)  pantoprazole (PROTONIX) injection 40 mg (40 mg Intravenous Given 07/19/21 0405)  metoCLOPramide (REGLAN) injection 10 mg (10 mg Intravenous Given 07/19/21 0404)  sodium chloride 0.9 % bolus 1,000 mL (1,000 mLs Intravenous New Bag/Given 07/19/21 0436)   5:11 AM Patient feeling better after 3 L of normal saline IV.  He is able to drink fluids without vomiting.  He was advised to continue home rehydration, preferably with a rehydration solution such as Gatorade.  His diarrhea is likely due to infectious etiology, but he was not able to provide a specimen for PCR analysis in the ED.   PROCEDURES  Procedures   ED DIAGNOSES     ICD-10-CM   1. Diarrhea of presumed infectious origin  R19.7     2. Dehydration  E86.0          Tiwanda Threats, Jenny Reichmann,  MD 07/19/21 [id number]

## 2021-07-19 NOTE — ED Triage Notes (Signed)
Reports diarrhea and generalized weakness x 48 hours. Nauseated but denies vomiting.

## 2021-09-02 DIAGNOSIS — L409 Psoriasis, unspecified: Secondary | ICD-10-CM | POA: Diagnosis not present

## 2021-09-02 DIAGNOSIS — E782 Mixed hyperlipidemia: Secondary | ICD-10-CM | POA: Diagnosis not present

## 2021-09-02 DIAGNOSIS — Z1211 Encounter for screening for malignant neoplasm of colon: Secondary | ICD-10-CM | POA: Diagnosis not present

## 2021-09-02 DIAGNOSIS — Z86010 Personal history of colonic polyps: Secondary | ICD-10-CM | POA: Diagnosis not present

## 2021-09-08 DIAGNOSIS — K648 Other hemorrhoids: Secondary | ICD-10-CM | POA: Diagnosis not present

## 2021-09-08 DIAGNOSIS — Z1211 Encounter for screening for malignant neoplasm of colon: Secondary | ICD-10-CM | POA: Diagnosis not present

## 2021-09-08 LAB — HM COLONOSCOPY

## 2021-09-10 ENCOUNTER — Other Ambulatory Visit: Payer: Self-pay | Admitting: Gastroenterology

## 2021-09-10 DIAGNOSIS — R933 Abnormal findings on diagnostic imaging of other parts of digestive tract: Secondary | ICD-10-CM

## 2021-09-11 ENCOUNTER — Encounter: Payer: Self-pay | Admitting: Adult Health

## 2021-10-08 ENCOUNTER — Ambulatory Visit
Admission: RE | Admit: 2021-10-08 | Discharge: 2021-10-08 | Disposition: A | Discharge status: Home / Self Care | Payer: BC Managed Care – PPO | Source: Ambulatory Visit | Attending: Gastroenterology | Admitting: Gastroenterology

## 2021-10-08 DIAGNOSIS — I700 Atherosclerosis of aorta: Secondary | ICD-10-CM | POA: Diagnosis not present

## 2021-10-08 DIAGNOSIS — R933 Abnormal findings on diagnostic imaging of other parts of digestive tract: Secondary | ICD-10-CM

## 2021-10-08 DIAGNOSIS — K7689 Other specified diseases of liver: Secondary | ICD-10-CM | POA: Diagnosis not present

## 2021-10-08 MED ORDER — IOPAMIDOL (ISOVUE-300) INJECTION 61%
100.0000 mL | Freq: Once | INTRAVENOUS | Status: AC | PRN
  Administered 2021-10-08: 100 mL via INTRAVENOUS

## 2022-02-15 ENCOUNTER — Other Ambulatory Visit: Payer: Self-pay | Admitting: Adult Health

## 2022-02-15 DIAGNOSIS — E785 Hyperlipidemia, unspecified: Secondary | ICD-10-CM

## 2022-04-21 ENCOUNTER — Encounter: Payer: Self-pay | Admitting: Adult Health

## 2022-04-21 ENCOUNTER — Ambulatory Visit (INDEPENDENT_AMBULATORY_CARE_PROVIDER_SITE_OTHER): Payer: BC Managed Care – PPO | Admitting: Adult Health

## 2022-04-21 VITALS — BP 120/70 | HR 80 | Temp 97.6°F | Ht 69.0 in | Wt 168.0 lb

## 2022-04-21 DIAGNOSIS — Z125 Encounter for screening for malignant neoplasm of prostate: Secondary | ICD-10-CM

## 2022-04-21 DIAGNOSIS — L408 Other psoriasis: Secondary | ICD-10-CM

## 2022-04-21 DIAGNOSIS — E785 Hyperlipidemia, unspecified: Secondary | ICD-10-CM

## 2022-04-21 DIAGNOSIS — Z23 Encounter for immunization: Secondary | ICD-10-CM

## 2022-04-21 DIAGNOSIS — Z0000 Encounter for general adult medical examination without abnormal findings: Secondary | ICD-10-CM

## 2022-04-21 DIAGNOSIS — D171 Benign lipomatous neoplasm of skin and subcutaneous tissue of trunk: Secondary | ICD-10-CM

## 2022-04-21 LAB — COMPREHENSIVE METABOLIC PANEL
ALT: 76 U/L — ABNORMAL HIGH (ref 0–53)
AST: 47 U/L — ABNORMAL HIGH (ref 0–37)
Albumin: 4.4 g/dL (ref 3.5–5.2)
Alkaline Phosphatase: 44 U/L (ref 39–117)
BUN: 17 mg/dL (ref 6–23)
CO2: 27 mEq/L (ref 19–32)
Calcium: 9.8 mg/dL (ref 8.4–10.5)
Chloride: 103 mEq/L (ref 96–112)
Creatinine, Ser: 0.89 mg/dL (ref 0.40–1.50)
GFR: 94.98 mL/min (ref >60.00)
Glucose, Bld: 99 mg/dL (ref 70–99)
Potassium: 4.1 mEq/L (ref 3.5–5.1)
Sodium: 140 mEq/L (ref 135–145)
Total Bilirubin: 0.9 mg/dL (ref 0.2–1.2)
Total Protein: 7.1 g/dL (ref 6.0–8.3)

## 2022-04-21 LAB — LIPID PANEL
Cholesterol: 202 mg/dL — ABNORMAL HIGH (ref 0–200)
HDL: 94.4 mg/dL (ref >39.00)
LDL Cholesterol: 88 mg/dL (ref 0–99)
NonHDL: 107.6
Total CHOL/HDL Ratio: 2
Triglycerides: 98 mg/dL (ref 0.0–149.0)
VLDL: 19.6 mg/dL (ref 0.0–40.0)

## 2022-04-21 LAB — CBC
HCT: 44.9 % (ref 39.0–52.0)
Hemoglobin: 15 g/dL (ref 13.0–17.0)
MCHC: 33.4 g/dL (ref 30.0–36.0)
MCV: 93.1 fl (ref 78.0–100.0)
Platelets: 260 10*3/uL (ref 150.0–400.0)
RBC: 4.82 Mil/uL (ref 4.22–5.81)
RDW: 13.4 % (ref 11.5–15.5)
WBC: 7.9 10*3/uL (ref 4.0–10.5)

## 2022-04-21 LAB — PSA: PSA: 1.31 ng/mL (ref 0.10–4.00)

## 2022-04-21 LAB — TSH: TSH: 2.04 u[IU]/mL (ref 0.35–5.50)

## 2022-04-21 NOTE — Progress Notes (Signed)
Subjective:    Patient ID: Timothy Hardin, male    DOB: [birth year], 57 y.o.   MRN: [id number]  HPI Patient presents for yearly preventative medicine examination. He is a pleasant 57 year old male who  has a past medical history of Anemia, Arthritis, Elevated liver function tests, Encounter for blood transfusion, adenomatous polyp of colon (03/18/2015), Hyperlipidemia, Plaque psoriasis, and Spinal stenosis in cervical region.  Hyperlipidemia -current treatment includes Lipitor 20 mg daily.  He denies myalgia or fatigue.  Plaque psoriasis- using lotion. Is interested in seeing dermatology for treatments and routine skin checks.   Lipoma - he has had for multiple years - on his lower back. He does not think it has become bigger.Is not painful. He would like to have it removed.   All immunizations and health maintenance protocols were reviewed with the patient and needed orders were placed.  Appropriate screening laboratory values were ordered for the patient including screening of hyperlipidemia, renal function and hepatic function. If indicated by BPH, a PSA was ordered.  Medication reconciliation,  past medical history, social history, problem list and allergies were reviewed in detail with the patient  Goals were established with regard to weight loss, exercise, and  diet in compliance with medications Wt Readings from Last 3 Encounters:  04/21/22 168 lb (76.2 kg)  07/19/21 160 lb (72.6 kg)  07/08/21 159 lb (72.1 kg)   Review of Systems  Constitutional: Negative.   HENT: Negative.    Eyes: Negative.   Respiratory: Negative.    Cardiovascular: Negative.   Gastrointestinal: Negative.   Endocrine: Negative.   Genitourinary: Negative.   Musculoskeletal: Negative.   Skin:  Positive for rash.  Allergic/Immunologic: Negative.   Neurological: Negative.   Hematological: Negative.   Psychiatric/Behavioral: Negative.    All other systems reviewed and are negative.  Past Medical  History:  Diagnosis Date   Anemia    dx 6 mos. old   Arthritis    Elevated liver function tests    Encounter for blood transfusion    anemia as an infant   Hx of adenomatous polyp of colon 03/18/2015   Hyperlipidemia    Plaque psoriasis    Spinal stenosis in cervical region     Social History   Socioeconomic History   Marital status: Single    Spouse name: Not on file   Number of children: Not on file   Years of education: Not on file   Highest education level: Not on file  Occupational History   Not on file  Tobacco Use   Smoking status: Never   Smokeless tobacco: Never  Substance and Sexual Activity   Alcohol use: Yes    Alcohol/week: 24.0 standard drinks of alcohol    Types: 24 Cans of beer per week    Comment: 6 daily   Drug use: Yes    Frequency: 7.0 times per week    Types: Marijuana    Comment: daily   Sexual activity: Not on file  Other Topics Concern   Not on file  Social History Narrative   Divorced, 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   [place]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               Social Determinants of Health   Financial Resource Strain: Not on file  Food Insecurity: Not on file  Transportation Needs: Not on file  Physical Activity: Not on file  Stress: Not on file  Social Connections: Not on file  Intimate Partner Violence:  Not on file    Past Surgical History:  Procedure Laterality Date   ANTERIOR CERVICAL DECOMP/DISCECTOMY FUSION  11/23/2011   Procedure: ANTERIOR CERVICAL DECOMPRESSION/DISCECTOMY FUSION 2 LEVELS;  Surgeon: Ophelia Charter, MD;  Location: [place] [facility];  Service: Neurosurgery;  Laterality: N/A;  Cervical four-five Cervical five-six anterior cervical decompression with fusion interbody prothesis plating and bonegraft   MANDIBLE SURGERY  25 years ago   reduction   TONSILLECTOMY      Family History  Adopted: Yes  Problem Relation Age of Onset   Colon cancer Neg Hx     Allergies  Allergen Reactions   Meperidine Hcl     REACTION:  hives   Hydrocodone-Acetaminophen Nausea Only    Intolerance    Current Outpatient Medications on File Prior to Visit  Medication Sig Dispense Refill   atorvastatin (LIPITOR) 20 MG tablet TAKE 1 TABLET BY MOUTH EVERY DAY 90 tablet 3   betamethasone dipropionate 0.05 % cream APPLY TO AFFECTED AREA TWICE A DAY 45 g 3   calcipotriene-betamethasone (TACLONEX) ointment Apply topically daily. 100 g 3   metoCLOPramide (REGLAN) 10 MG tablet Take 1 tablet (10 mg total) by mouth every 6 (six) hours as needed for nausea or vomiting. 12 tablet 0   No current facility-administered medications on file prior to visit.    BP 120/70   Pulse 80   Temp 97.6 F (36.4 C) (Oral)   Ht '5\' 9"'$  (1.753 m)   Wt 168 lb (76.2 kg)   SpO2 99%   BMI 24.81 kg/m       Objective:   Physical Exam Vitals and nursing note reviewed.  Constitutional:      General: He is not in acute distress.    Appearance: Normal appearance. He is not ill-appearing.  HENT:     Head: Normocephalic and atraumatic.     Right Ear: Tympanic membrane, ear canal and external ear normal. There is no impacted cerumen.     Left Ear: Tympanic membrane, ear canal and external ear normal. There is no impacted cerumen.     Nose: Nose normal. No congestion or rhinorrhea.     Mouth/Throat:     Mouth: Mucous membranes are moist.     Pharynx: Oropharynx is clear.  Eyes:     Extraocular Movements: Extraocular movements intact.     Conjunctiva/sclera: Conjunctivae normal.     Pupils: Pupils are equal, round, and reactive to light.  Neck:     Vascular: No carotid bruit.  Cardiovascular:     Rate and Rhythm: Normal rate and regular rhythm.     Pulses: Normal pulses.     Heart sounds: No murmur heard.    No friction rub. No gallop.  Pulmonary:     Effort: Pulmonary effort is normal.     Breath sounds: Normal breath sounds.  Abdominal:     General: Abdomen is flat. Bowel sounds are normal. There is no distension.     Palpations: Abdomen is  soft. There is no mass.     Tenderness: There is no abdominal tenderness. There is no guarding or rebound.     Hernia: No hernia is present.  Musculoskeletal:        General: Normal range of motion.     Cervical back: Normal range of motion and neck supple.  Lymphadenopathy:     Cervical: No cervical adenopathy.  Skin:    General: Skin is warm and dry.     Capillary Refill: Capillary refill takes less  than 2 seconds.     Findings: Rash present.  Neurological:     General: No focal deficit present.     Mental Status: He is alert and oriented to person, place, and time.  Psychiatric:        Mood and Affect: Mood normal.        Behavior: Behavior normal.        Thought Content: Thought content normal.        Judgment: Judgment normal.        Assessment & Plan:  1. Routine general medical examination at a health care facility Today patient counseled on age appropriate routine health concerns for screening and prevention, each reviewed and up to date or declined. Immunizations reviewed and up to date or declined. Labs ordered and reviewed. Risk factors for depression reviewed and negative. Hearing function and visual acuity are intact. ADLs screened and addressed as needed. Functional ability and level of safety reviewed and appropriate. Education, counseling and referrals performed based on assessed risks today. Patient provided with a copy of personalized plan for preventive services. - 2nd shingrix vaccination given today   2. Hyperlipidemia, unspecified hyperlipidemia type - Consider increase in statin  - Lipid panel; Future - TSH; Future - CBC; Future - Comprehensive metabolic panel; Future  3. Prostate cancer screening  - PSA; Future  4. PSORIASIS  - Lipid panel; Future - TSH; Future - CBC; Future - Comprehensive metabolic panel; Future - Ambulatory referral to Dermatology  5. Lipoma of torso  - Ambulatory referral to [place], NP

## 2022-04-21 NOTE — Patient Instructions (Signed)
It was great seeing you today   We will follow up with you regarding your lab work   Please let me know if you need anything   

## 2022-06-15 DIAGNOSIS — D171 Benign lipomatous neoplasm of skin and subcutaneous tissue of trunk: Secondary | ICD-10-CM | POA: Diagnosis not present

## 2022-07-08 DIAGNOSIS — D171 Benign lipomatous neoplasm of skin and subcutaneous tissue of trunk: Secondary | ICD-10-CM | POA: Diagnosis not present

## 2022-12-08 ENCOUNTER — Encounter: Payer: Self-pay | Admitting: Adult Health

## 2022-12-13 IMAGING — DX DG FOOT COMPLETE 3+V*L*
3 series · 3 of 3 positions shown · non-contrast
Comparison: None Available.

CLINICAL DATA: Left great toe pain.

EXAM:
LEFT FOOT - COMPLETE 3+ VIEW

[foot ap]
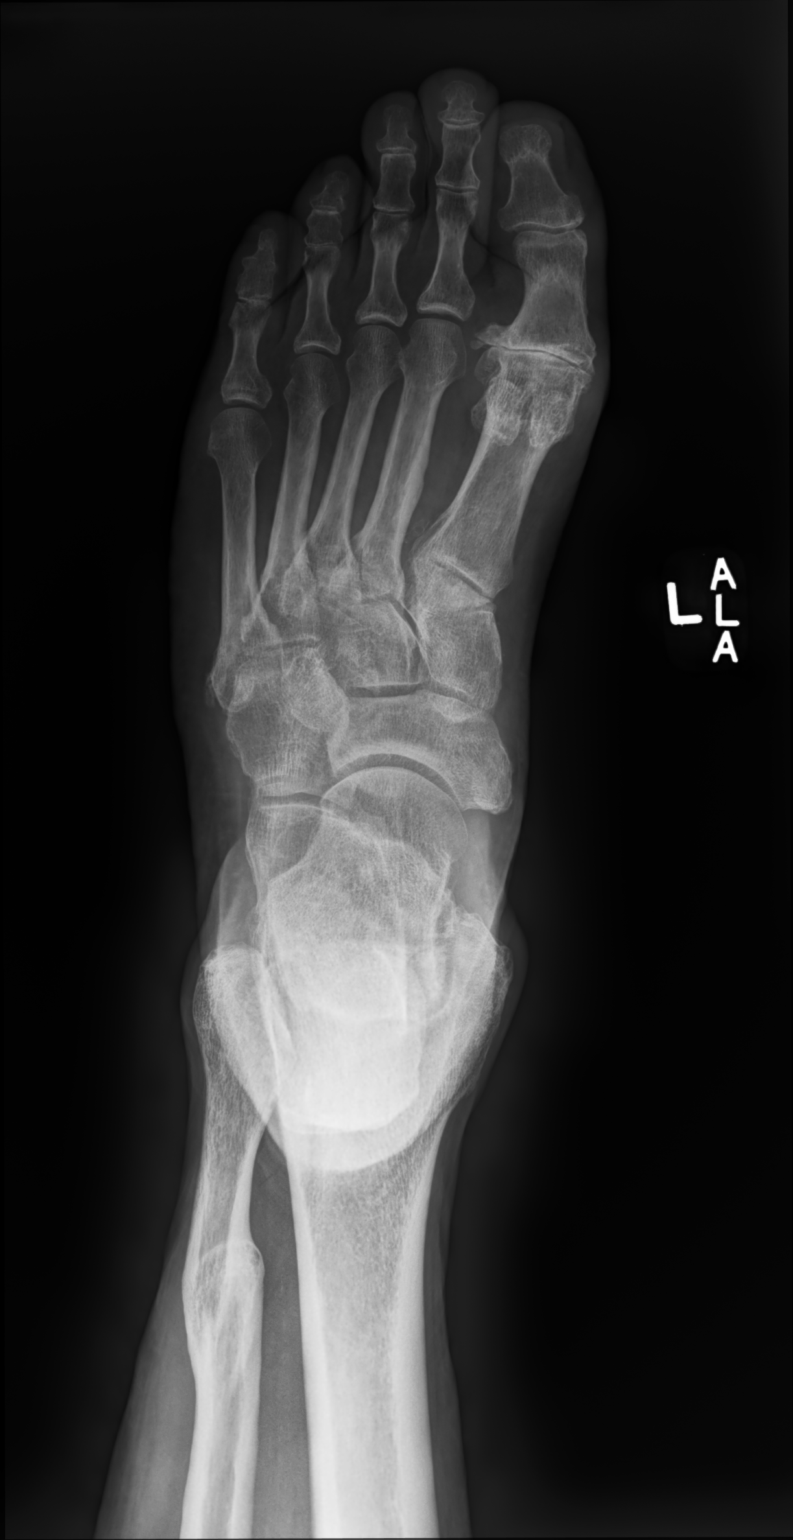

[foot mlo]
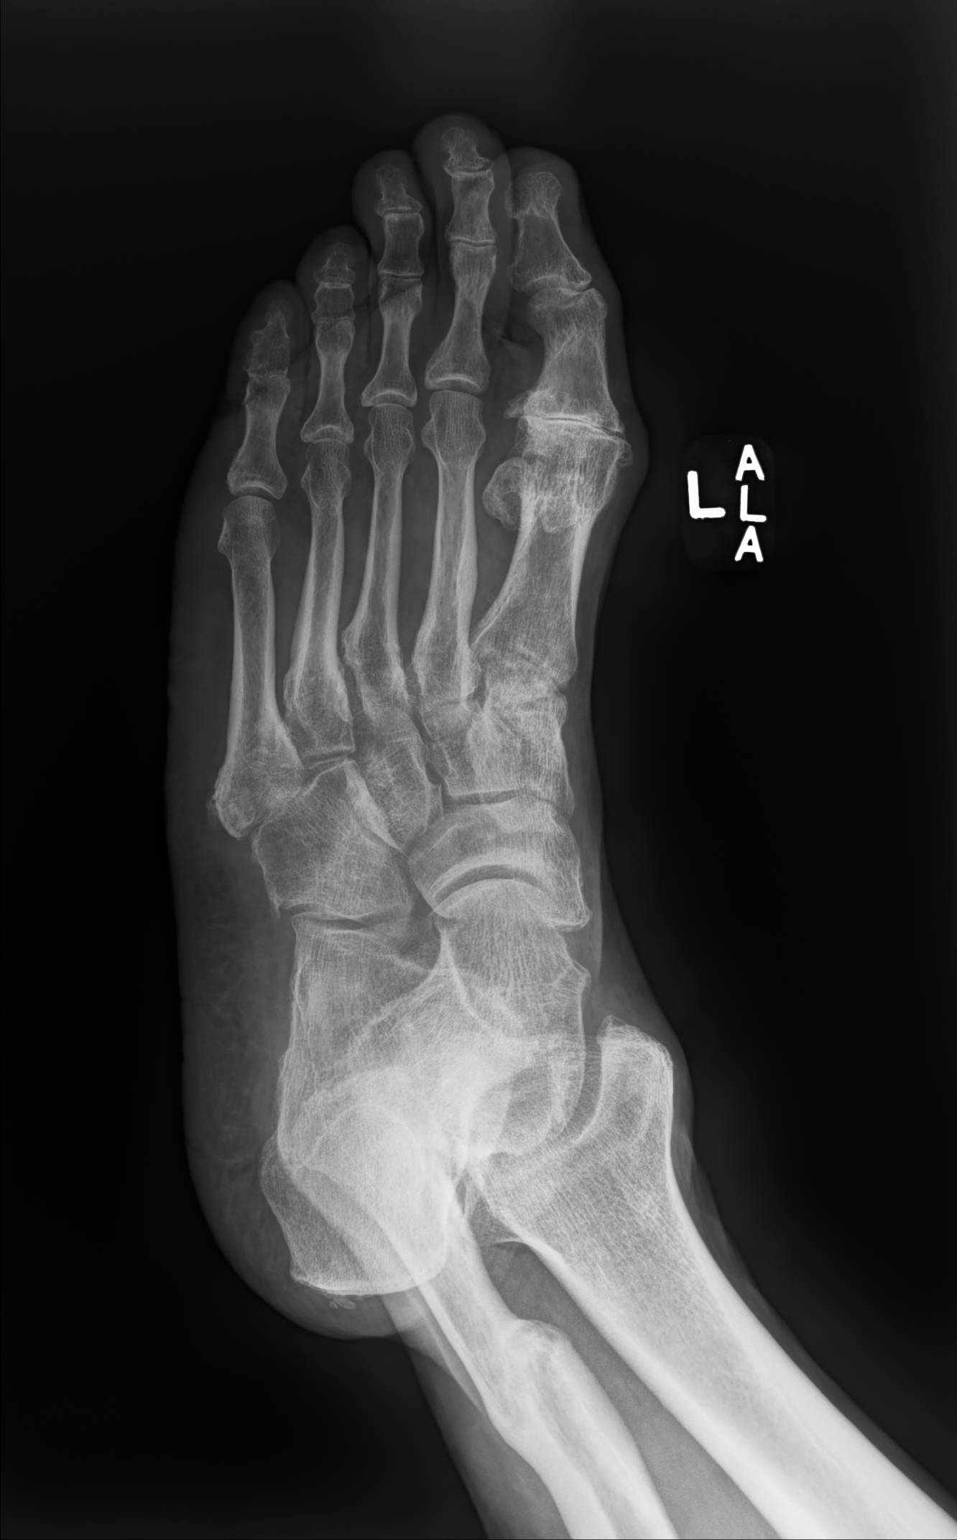

[foot lat]
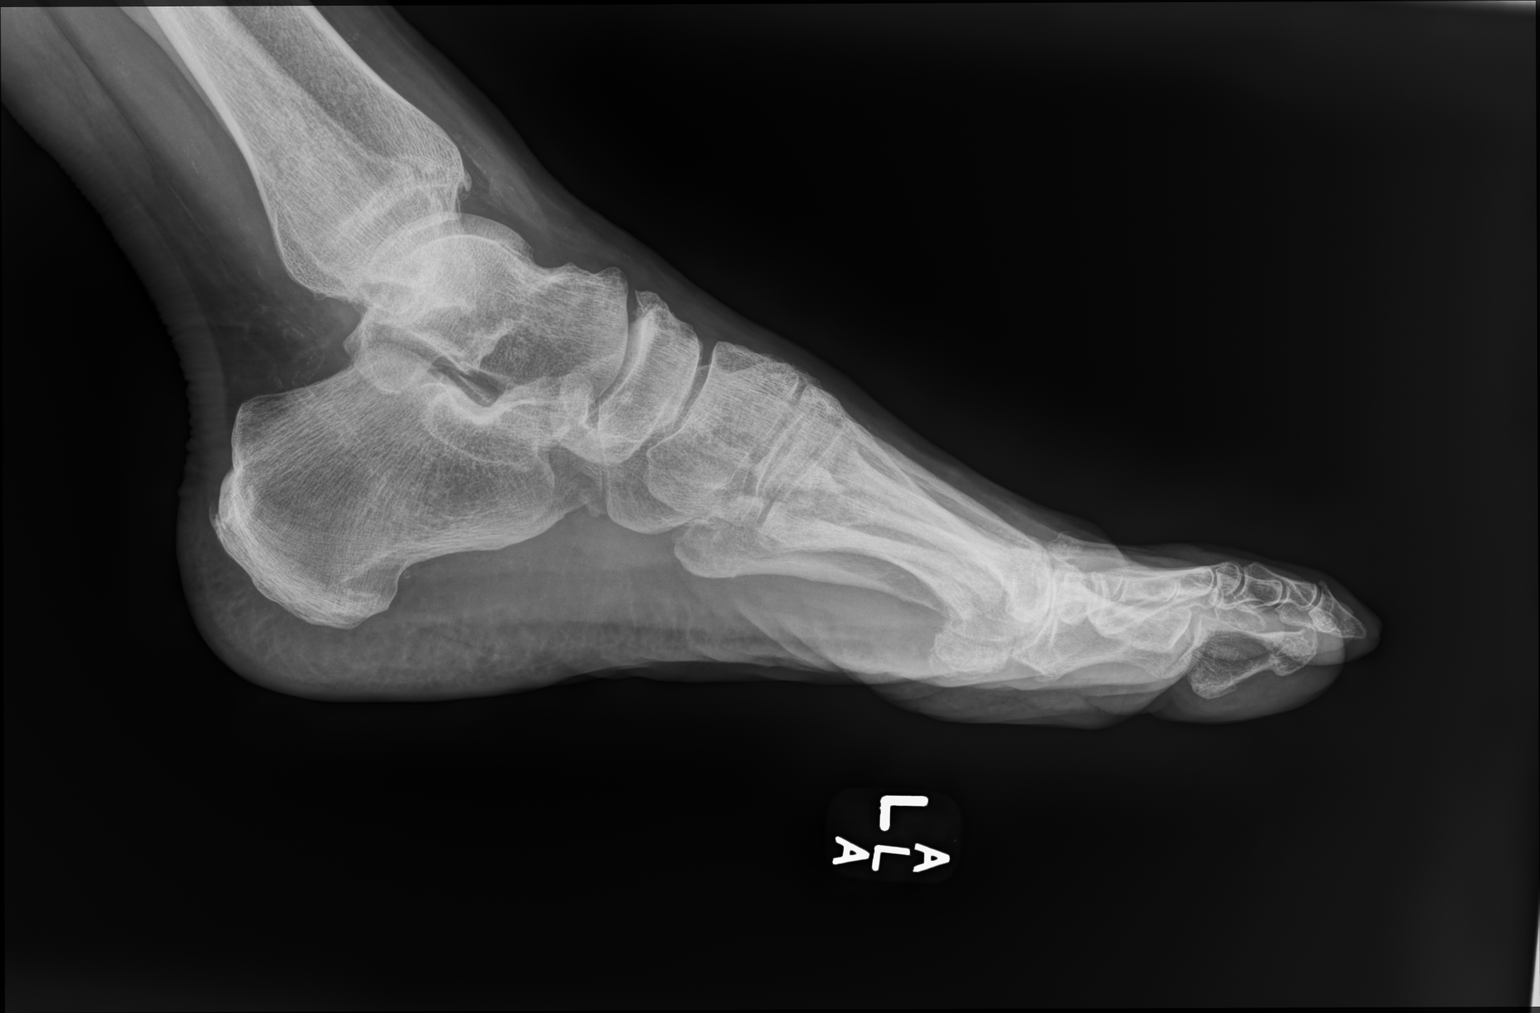

[3 of 3 positions shown; findings below may reference images not displayed]

FINDINGS: There is severe great toe metatarsophalangeal joint space narrowing
with bone-on-bone contact and large lateral and moderate medial
degenerative osteophytes. Small corticated ossicle at the lateral
aspect of the joint. Subchondral sclerosis.

Mild posterior dorsal navicular and adjacent dorsal talar neck
degenerative spurring. Minimal degenerative spurring at the distal
anterior tibial plafond. Old healed mildly displaced fracture of the
distal fibular diaphysis. No acute fracture is seen. No dislocation.
IMPRESSION: Severe great toe metatarsophalangeal joint osteoarthritis.

## 2023-02-11 ENCOUNTER — Other Ambulatory Visit: Payer: Self-pay | Admitting: Adult Health

## 2023-02-11 DIAGNOSIS — E785 Hyperlipidemia, unspecified: Secondary | ICD-10-CM

## 2023-05-12 ENCOUNTER — Ambulatory Visit (INDEPENDENT_AMBULATORY_CARE_PROVIDER_SITE_OTHER): Admitting: Adult Health

## 2023-05-12 ENCOUNTER — Encounter: Payer: Self-pay | Admitting: Adult Health

## 2023-05-12 VITALS — BP 120/86 | HR 69 | Temp 97.7°F | Ht 69.0 in | Wt 168.0 lb

## 2023-05-12 DIAGNOSIS — Z0000 Encounter for general adult medical examination without abnormal findings: Secondary | ICD-10-CM | POA: Diagnosis not present

## 2023-05-12 DIAGNOSIS — Z125 Encounter for screening for malignant neoplasm of prostate: Secondary | ICD-10-CM | POA: Diagnosis not present

## 2023-05-12 DIAGNOSIS — L408 Other psoriasis: Secondary | ICD-10-CM | POA: Diagnosis not present

## 2023-05-12 DIAGNOSIS — E785 Hyperlipidemia, unspecified: Secondary | ICD-10-CM

## 2023-05-12 LAB — LIPID PANEL
Cholesterol: 179 mg/dL (ref 0–200)
HDL: 89.4 mg/dL (ref >39.00)
LDL Cholesterol: 75 mg/dL (ref 0–99)
NonHDL: 89.6
Total CHOL/HDL Ratio: 2
Triglycerides: 73 mg/dL (ref 0.0–149.0)
VLDL: 14.6 mg/dL (ref 0.0–40.0)

## 2023-05-12 LAB — CBC
HCT: 43.9 % (ref 39.0–52.0)
Hemoglobin: 14.8 g/dL (ref 13.0–17.0)
MCHC: 33.7 g/dL (ref 30.0–36.0)
MCV: 94 fl (ref 78.0–100.0)
Platelets: 214 10*3/uL (ref 150.0–400.0)
RBC: 4.67 Mil/uL (ref 4.22–5.81)
RDW: 13.7 % (ref 11.5–15.5)
WBC: 7.3 10*3/uL (ref 4.0–10.5)

## 2023-05-12 LAB — COMPREHENSIVE METABOLIC PANEL WITH GFR
ALT: 45 U/L (ref 0–53)
AST: 38 U/L — ABNORMAL HIGH (ref 0–37)
Albumin: 4.4 g/dL (ref 3.5–5.2)
Alkaline Phosphatase: 40 U/L (ref 39–117)
BUN: 15 mg/dL (ref 6–23)
CO2: 29 meq/L (ref 19–32)
Calcium: 9.3 mg/dL (ref 8.4–10.5)
Chloride: 102 meq/L (ref 96–112)
Creatinine, Ser: 0.88 mg/dL (ref 0.40–1.50)
GFR: 94.59 mL/min (ref >60.00)
Glucose, Bld: 102 mg/dL — ABNORMAL HIGH (ref 70–99)
Potassium: 4.3 meq/L (ref 3.5–5.1)
Sodium: 139 meq/L (ref 135–145)
Total Bilirubin: 0.7 mg/dL (ref 0.2–1.2)
Total Protein: 7 g/dL (ref 6.0–8.3)

## 2023-05-12 LAB — TSH: TSH: 2.35 u[IU]/mL (ref 0.35–5.50)

## 2023-05-12 LAB — PSA: PSA: 1.21 ng/mL (ref 0.10–4.00)

## 2023-05-12 MED ORDER — ATORVASTATIN CALCIUM 20 MG PO TABS: 20.0000 mg | ORAL_TABLET | Freq: Every day | ORAL | 3 refills | Status: AC

## 2023-05-12 NOTE — Progress Notes (Signed)
 Subjective:    Patient ID: Jesse Sans, male    DOB: 06/15/1965, 58 y.o.   MRN: [id number]  HPI Patient presents for yearly preventative medicine examination. He is a pleasant 58 year old male who  has a past medical history of Anemia, Arthritis, Elevated liver function tests, Encounter for blood transfusion, adenomatous polyp of colon (03/18/2015), Hyperlipidemia, Plaque psoriasis, and Spinal stenosis in cervical region.  Hyperlipidemia -current treatment includes Lipitor 20 mg daily.  He denies myalgia or fatigue. Lab Results  Component Value Date   CHOL 202 (H) 04/21/2022   HDL 94.40 04/21/2022   LDLCALC 88 04/21/2022   LDLDIRECT 57.0 11/04/2018   TRIG 98.0 04/21/2022   CHOLHDL 2 04/21/2022   Plaque psoriasis- using lotion. Is interested in seeing dermatology for treatments and routine skin checks. He was referred to dermatology last year but did not go. He would like to be referred again.   All immunizations and health maintenance protocols were reviewed with the patient and needed orders were placed.  Appropriate screening laboratory values were ordered for the patient including screening of hyperlipidemia, renal function and hepatic function. If indicated by BPH, a PSA was ordered.  Medication reconciliation,  past medical history, social history, problem list and allergies were reviewed in detail with the patient  Goals were established with regard to weight loss, exercise, and  diet in compliance with medications. He stays active and enjoys exercising. He eats healthy a majority of the time  Wt Readings from Last 3 Encounters:  05/12/23 168 lb (76.2 kg)  04/21/22 168 lb (76.2 kg)  07/19/21 160 lb (72.6 kg)    Review of Systems  Constitutional: Negative.   HENT: Negative.    Eyes: Negative.   Respiratory: Negative.    Cardiovascular: Negative.   Gastrointestinal: Negative.   Endocrine: Negative.   Genitourinary: Negative.   Musculoskeletal: Negative.   Skin:  Negative.   Allergic/Immunologic: Negative.   Neurological: Negative.   Hematological: Negative.   Psychiatric/Behavioral: Negative.    All other systems reviewed and are negative.  Past Medical History:  Diagnosis Date   Anemia    dx 6 mos. old   Arthritis    Elevated liver function tests    Encounter for blood transfusion    anemia as an infant   Hx of adenomatous polyp of colon 03/18/2015   Hyperlipidemia    Plaque psoriasis    Spinal stenosis in cervical region     Social History   Socioeconomic History   Marital status: Single    Spouse name: Not on file   Number of children: Not on file   Years of education: Not on file   Highest education level: Not on file  Occupational History   Not on file  Tobacco Use   Smoking status: Never   Smokeless tobacco: Never  Substance and Sexual Activity   Alcohol use: Yes    Alcohol/week: 24.0 standard drinks of alcohol    Types: 24 Cans of beer per week    Comment: 6 daily   Drug use: Yes    Frequency: 7.0 times per week    Types: Marijuana    Comment: daily   Sexual activity: Not on file  Other Topics Concern   Not on file  Social History Narrative   ** Merged History Encounter **       Divorced, 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        Social Drivers of Health  Financial Resource Strain: Not on file  Food Insecurity: Not on file  Transportation Needs: Not on file  Physical Activity: Not on file  Stress: Not on file  Social Connections: Not on file  Intimate Partner Violence: Not on file    Past Surgical History:  Procedure Laterality Date   ANTERIOR CERVICAL DECOMP/DISCECTOMY FUSION  11/23/2011   Procedure: ANTERIOR CERVICAL DECOMPRESSION/DISCECTOMY FUSION 2 LEVELS;  Surgeon: Cristi Loron, MD;  Location: [facility];  Service: Neurosurgery;  Laterality: N/A;  Cervical four-five Cervical five-six anterior cervical decompression with fusion interbody prothesis plating and bonegraft    MANDIBLE SURGERY  25 years ago   reduction   TONSILLECTOMY      Family History  Adopted: Yes  Problem Relation Age of Onset   Colon cancer Neg Hx     Allergies  Allergen Reactions   Meperidine Hcl     REACTION: hives   Hydrocodone-Acetaminophen Nausea Only    Intolerance    No current outpatient medications on file prior to visit.   No current facility-administered medications on file prior to visit.    BP 120/86   Pulse 69   Temp 97.7 F (36.5 C) (Oral)   Ht 5\' 9"  (1.753 m)   Wt 168 lb (76.2 kg)   SpO2 99%   BMI 24.81 kg/m       Objective:   Physical Exam Vitals and nursing note reviewed.  Constitutional:      General: He is not in acute distress.    Appearance: Normal appearance. He is not ill-appearing.  HENT:     Head: Normocephalic and atraumatic.     Right Ear: Tympanic membrane, ear canal and external ear normal. There is no impacted cerumen.     Left Ear: Tympanic membrane, ear canal and external ear normal. There is no impacted cerumen.     Nose: Nose normal. No congestion or rhinorrhea.     Mouth/Throat:     Mouth: Mucous membranes are moist.     Pharynx: Oropharynx is clear.  Eyes:     Extraocular Movements: Extraocular movements intact.     Conjunctiva/sclera: Conjunctivae normal.     Pupils: Pupils are equal, round, and reactive to light.  Neck:     Vascular: No carotid bruit.  Cardiovascular:     Rate and Rhythm: Normal rate and regular rhythm.     Pulses: Normal pulses.     Heart sounds: No murmur heard.    No friction rub. No gallop.  Pulmonary:     Effort: Pulmonary effort is normal.     Breath sounds: Normal breath sounds.  Abdominal:     General: Abdomen is flat. Bowel sounds are normal. There is no distension.     Palpations: Abdomen is soft. There is no mass.     Tenderness: There is no abdominal tenderness. There is no guarding or rebound.     Hernia: No hernia is present.  Musculoskeletal:        General: Normal range of  motion.     Cervical back: Normal range of motion and neck supple.  Lymphadenopathy:     Cervical: No cervical adenopathy.  Skin:    General: Skin is warm and dry.     Capillary Refill: Capillary refill takes less than 2 seconds.     Findings: Rash (Patches of plaque psoriasis located on legs and torso) present.  Neurological:     General: No focal deficit present.     Mental Status: He is  alert and oriented to person, place, and time.  Psychiatric:        Mood and Affect: Mood normal.        Behavior: Behavior normal.        Thought Content: Thought content normal.        Judgment: Judgment normal.        Assessment & Plan:  1. Routine general medical examination at a health care facility (Primary) Today patient counseled on age appropriate routine health concerns for screening and prevention, each reviewed and up to date or declined. Immunizations reviewed and up to date or declined. Labs ordered and reviewed. Risk factors for depression reviewed and negative. Hearing function and visual acuity are intact. ADLs screened and addressed as needed. Functional ability and level of safety reviewed and appropriate. Education, counseling and referrals performed based on assessed risks today. Patient provided with a copy of personalized plan for preventive services. - Continue with eating healthy and exercise  - Follow up in one year or sooner if needed   2. Hyperlipidemia, unspecified hyperlipidemia type - Continue with Lipitor 20 mg  - Lipid panel; Future - TSH; Future - CBC; Future - Comprehensive metabolic panel with GFR; Future - atorvastatin (LIPITOR) 20 MG tablet; Take 1 tablet (20 mg total) by mouth daily.  Dispense: 90 tablet; Refill: 3  3. PSORIASIS  - Lipid panel; Future - TSH; Future - CBC; Future - Comprehensive metabolic panel with GFR; Future - Ambulatory referral to Dermatology  4. Prostate cancer screening  - PSA; Future  Shirline Frees, NP

## 2023-08-25 ENCOUNTER — Ambulatory Visit: Admitting: Dermatology

## 2023-08-25 ENCOUNTER — Encounter: Payer: Self-pay | Admitting: Dermatology

## 2023-08-25 VITALS — BP 119/67 | HR 70

## 2023-08-25 DIAGNOSIS — L409 Psoriasis, unspecified: Secondary | ICD-10-CM

## 2023-08-25 DIAGNOSIS — W908XXA Exposure to other nonionizing radiation, initial encounter: Secondary | ICD-10-CM

## 2023-08-25 DIAGNOSIS — D225 Melanocytic nevi of trunk: Secondary | ICD-10-CM

## 2023-08-25 DIAGNOSIS — L814 Other melanin hyperpigmentation: Secondary | ICD-10-CM | POA: Diagnosis not present

## 2023-08-25 DIAGNOSIS — Z1211 Encounter for screening for malignant neoplasm of colon: Secondary | ICD-10-CM | POA: Insufficient documentation

## 2023-08-25 DIAGNOSIS — D229 Melanocytic nevi, unspecified: Secondary | ICD-10-CM

## 2023-08-25 DIAGNOSIS — L821 Other seborrheic keratosis: Secondary | ICD-10-CM

## 2023-08-25 DIAGNOSIS — D492 Neoplasm of unspecified behavior of bone, soft tissue, and skin: Secondary | ICD-10-CM | POA: Diagnosis not present

## 2023-08-25 DIAGNOSIS — R933 Abnormal findings on diagnostic imaging of other parts of digestive tract: Secondary | ICD-10-CM | POA: Insufficient documentation

## 2023-08-25 DIAGNOSIS — Z139 Encounter for screening, unspecified: Secondary | ICD-10-CM

## 2023-08-25 DIAGNOSIS — D485 Neoplasm of uncertain behavior of skin: Secondary | ICD-10-CM

## 2023-08-25 DIAGNOSIS — D1801 Hemangioma of skin and subcutaneous tissue: Secondary | ICD-10-CM | POA: Diagnosis not present

## 2023-08-25 DIAGNOSIS — L578 Other skin changes due to chronic exposure to nonionizing radiation: Secondary | ICD-10-CM

## 2023-08-25 DIAGNOSIS — Z1283 Encounter for screening for malignant neoplasm of skin: Secondary | ICD-10-CM

## 2023-08-25 DIAGNOSIS — K219 Gastro-esophageal reflux disease without esophagitis: Secondary | ICD-10-CM | POA: Insufficient documentation

## 2023-08-25 NOTE — Progress Notes (Unsigned)
 New Patient Visit   Subjective  Timothy Hardin is a 58 y.o. male who presents for the following: Skin Cancer Screening and Upper Body Skin Exam  Patient present today for new patient visit for UBSE.The patient denies he  has spots, moles and lesions to be evaluated, some may be new or changing and the patient may have concern these could be cancer. Patient has previously been treated by a dermatologist (10 years ago). Patient reports he  does not have hx of bx. Patient denies family history of skin cancers. Patient reports throughout his lifetime has had moderate sun exposure. Currently, patient reports if he  has excessive sun exposure, he  does not apply sunscreen and/or wears protective coverings. The following portions of the chart were reviewed this encounter and updated as appropriate: medications, allergies, medical history  Review of Systems:  No other skin or systemic complaints except as noted in HPI or Assessment and Plan.  Objective  Well appearing patient in no apparent distress; mood and affect are within normal limits.  All skin waist up examined. Relevant physical exam findings are noted in the Assessment and Plan.       Mid lower Back 5 mm Dark brown irregular Macule  Assessment & Plan   Skin cancer screening performed today.  Actinic Damage - Chronic condition, secondary to cumulative UV/sun exposure - diffuse scaly erythematous macules with underlying dyspigmentation - Recommend daily broad spectrum sunscreen SPF 30+ to sun-exposed areas, reapply every 2 hours as needed.  - Staying in the shade or wearing long sleeves, sun glasses (UVA+UVB protection) and wide brim hats (4-inch brim around the entire circumference of the hat) are also recommended for sun protection.  - Call for new or changing lesions.  Lentigines, Seborrheic Keratoses, Hemangiomas - Benign normal skin lesions - Benign-appearing - Call for any changes  Melanocytic Nevi - Tan-brown and/or  pink-flesh-colored symmetric macules and papules - Benign appearing on exam today - Observation - Call clinic for new or changing moles - Recommend daily use of broad spectrum spf 30+ sunscreen to sun-exposed areas.   PSORIASIS Exam: Well-demarcated erythematous papules/plaques with silvery scale, guttate pink scaly papules. 15% BSA.  Flared  Patient reports joint pain  Psoriasis is a chronic non-curable, but treatable genetic/hereditary disease that may have other systemic features affecting other organ systems such as joints (Psoriatic Arthritis). It is associated with an increased risk of inflammatory bowel disease, heart disease, non-alcoholic fatty liver disease, and depression.  Treatments include light and laser treatments; topical medications; and systemic medications including oral and injectables.  Treatment Plan:    Provided patient education on biologics, specifically Skyrizi (risankizumab), as a potential treatment option    Discussed benefits of biologics for controlling both skin and joint inflammation    Ordered TB screening test (required before initiating biologic therapy)    Provided informational materials on Skyrizi for patient to review    Follow-up appointment scheduled in 4-6 weeks to discuss treatment options and review lab results    Patient to consider biologic therapy and formulate questions for next visit  NEOPLASM OF UNCERTAIN BEHAVIOR OF SKIN Mid lower Back Epidermal / dermal shaving  Lesion diameter (cm):  0.5 Informed consent: discussed and consent obtained   Timeout: patient name, date of birth, surgical site, and procedure verified   Procedure prep:  Patient was prepped and draped in usual sterile fashion Prep type:  Isopropyl alcohol Anesthesia: the lesion was anesthetized in a standard fashion   Anesthetic:  1% lidocaine  w/ epinephrine  1-100,000 buffered w/ 8.4% NaHCO3 Instrument used: flexible razor blade   Hemostasis achieved with: pressure,  aluminum chloride and electrodesiccation   Outcome: patient tolerated procedure well   Post-procedure details: sterile dressing applied and wound care instructions given   Dressing type: bandage and petrolatum    Specimen 1 - Surgical pathology Differential Diagnosis: R/O DN  Check Margins: No SCREENING DUE   Related Procedures QuantiFERON-TB Gold Plus  Return in about 6 weeks (around 10/06/2023) for Psoriasis F/U.  I, Jetta Ager, am acting as Neurosurgeon for [employer], DO.  Documentation: I have reviewed the above documentation for accuracy and completeness, and I agree with the above.  Delon Lenis, DO

## 2023-08-25 NOTE — Patient Instructions (Addendum)
 Date: Wed Aug 25 2023  Hello  Mr. Sheils,  Thank you for visiting today. Here is a summary of the key instructions:  - Sun Protection:   - Wear a hat when outside to protect your scalp from sun exposure   - Be cautious of sunburns and excessive sun exposure  - Procedure Information:   - A biopsy will be performed on a mole on your lower back   - The area will be numbed, then the mole will be shaved off and sent to the lab  - Follow-up Care:   - Return in 4-6 weeks for a follow-up appointment   - Results of the biopsy will be available in about a week   - You will receive a message through MyChart if the biopsy results are normal  - Medications:   - Information about Nelma, an injectable medication for psoriasis, will be provided for you to review  - Required Testing:   - Get blood work done to screen for TB before your next appointment  - Educational Materials:   - Read the provided information about Skyrizi before your next appointment   - Prepare any questions you have about Skyrizi for discussion at your next visit  Please reach out if you have any questions or concerns.  Warm regards,  Dr. Delon Lenis Dermatology Patient Handout: Wound Care for Skin Biopsy Site  Taking Care of Your Skin Biopsy Site  Proper care of the biopsy site is essential for promoting healing and minimizing scarring. This handout provides instructions on how to care for your biopsy site to ensure optimal recovery.  1. Cleaning the Wound:  Clean the biopsy site daily with gentle soap and water. Gently pat the area dry with a clean, soft towel. Avoid harsh scrubbing or rubbing the area, as this can irritate the skin and delay healing.  2. Applying Aquaphor and Bandage:  After cleaning the wound, apply a thin layer of Aquaphor ointment to the biopsy site. Cover the area with a sterile bandage to protect it from dirt, bacteria, and friction. Change the bandage daily or as needed if it  becomes soiled or wet.  3. Continued Care for One Week:  Repeat the cleaning, Aquaphor application, and bandaging process daily for one week following the biopsy procedure. Keeping the wound clean and moist during this initial healing period will help prevent infection and promote optimal healing.  4. Massaging Aquaphor into the Area:  ---After one week, discontinue the use of bandages but continue to apply Aquaphor to the biopsy site. ----Gently massage the Aquaphor into the area using circular motions. ---Massaging the skin helps to promote circulation and prevent the formation of scar tissue.   Additional Tips:  Avoid exposing the biopsy site to direct sunlight during the healing process, as this can cause hyperpigmentation or worsen scarring. If you experience any signs of infection, such as increased redness, swelling, warmth, or drainage from the wound, contact your healthcare provider immediately. Follow any additional instructions provided by your healthcare provider for caring for the biopsy site and managing any discomfort. Conclusion:  Taking proper care of your skin biopsy site is crucial for ensuring optimal healing and minimizing scarring. By following these instructions for cleaning, applying Aquaphor, and massaging the area, you can promote a smooth and successful recovery. If you have any questions or concerns about caring for your biopsy site, don't hesitate to contact your healthcare provider for guidance.          Important  Information   Due to recent changes in healthcare laws, you may see results of your pathology and/or laboratory studies on MyChart before the doctors have had a chance to review them. We understand that in some cases there may be results that are confusing or concerning to you. Please understand that not all results are received at the same time and often the doctors may need to interpret multiple results in order to provide you with the best  plan of care or course of treatment. Therefore, we ask that you please give us  2 business days to thoroughly review all your results before contacting the office for clarification. Should we see a critical lab result, you will be contacted sooner.     If You Need Anything After Your Visit   If you have any questions or concerns for your doctor, please call our main line at [phone]. If no one answers, please leave a voicemail as directed and we will return your call as soon as possible. Messages left after 4 pm will be answered the following business day.    You may also send us  a message via MyChart. We typically respond to MyChart messages within 1-2 business days.  For prescription refills, please ask your pharmacy to contact our office. Our fax number is [phone].  If you have an urgent issue when the clinic is closed that cannot wait until the next business day, you can page your doctor at the number below.     Please note that while we do our best to be available for urgent issues outside of office hours, we are not available 24/7.    If you have an urgent issue and are unable to reach us , you may choose to seek medical care at your doctor's office, retail clinic, urgent care center, or emergency room.   If you have a medical emergency, please immediately call 911 or go to the emergency department. In the event of inclement weather, please call our main line at [phone] for an update on the status of any delays or closures.  Dermatology Medication Tips: Please keep the boxes that topical medications come in in order to help keep track of the instructions about where and how to use these. Pharmacies typically print the medication instructions only on the boxes and not directly on the medication tubes.   If your medication is too expensive, please contact our office at [phone] or send us  a message through MyChart.    We are unable to tell what your co-pay for medications  will be in advance as this is different depending on your insurance coverage. However, we may be able to find a substitute medication at lower cost or fill out paperwork to get insurance to cover a needed medication.    If a prior authorization is required to get your medication covered by your insurance company, please allow us  1-2 business days to complete this process.   Drug prices often vary depending on where the prescription is filled and some pharmacies may offer cheaper prices.   The website www.goodrx.com contains coupons for medications through different pharmacies. The prices here do not account for what the cost may be with help from insurance (it may be cheaper with your insurance), but the website can give you the price if you did not use any insurance.  - You can print the associated coupon and take it with your prescription to the pharmacy.  - You may also stop by our office during regular  business hours and pick up a GoodRx coupon card.  - If you need your prescription sent electronically to a different pharmacy, notify our office through [facility] or by phone at [phone]

## 2023-08-25 NOTE — Progress Notes (Deleted)
   New Patient Visit   Subjective  Timothy Hardin is a 58 y.o. male who presents for the following: ***  Patient states {he/she/they:23295} has *** located at the {LOCATION ON BODY:21951} that {he/she/they:23295} would like to have examined. Patient reports the areas have been there for {NUMBER 1-10:22536} {Time; units week/month/year w plurals:19499}. {He/she (caps):30048} reports the areas {Actions; are/are not:16769} bothersome.Patient rates irritation {NUMBER 1-10:22536} out of 10. {He/she (caps):30048} states that the areas {ACTIONS; HAVE/HAVE NOT:19434} spread. Patient reports {he/she/they:23295} {HAS HAS WNU:81165} previously been treated for these areas. Patient *** Hx of bx. Patient *** family history of skin cancer(s).  The patient has spots, moles and lesions to be evaluated, some may be new or changing and the patient may have concern these could be cancer.   The following portions of the chart were reviewed this encounter and updated as appropriate: medications, allergies, medical history  Review of Systems:  No other skin or systemic complaints except as noted in HPI or Assessment and Plan.  Objective  Well appearing patient in no apparent distress; mood and affect are within normal limits.  ***A full examination was performed including scalp, head, eyes, ears, nose, lips, neck, chest, axillae, abdomen, back, buttocks, bilateral upper extremities, bilateral lower extremities, hands, feet, fingers, toes, fingernails, and toenails. All findings within normal limits unless otherwise noted below.  ***A focused examination was performed of the following areas: ***  Relevant exam findings are noted in the Assessment and Plan.    Assessment & Plan       No follow-ups on file.  ***  Documentation: I have reviewed the above documentation for accuracy and completeness, and I agree with the above.  Delon Lenis, DO

## 2023-08-26 LAB — SURGICAL PATHOLOGY

## 2023-08-31 ENCOUNTER — Ambulatory Visit: Payer: Self-pay | Admitting: Dermatology

## 2023-10-14 ENCOUNTER — Ambulatory Visit: Admitting: Dermatology
